# Patient Record
Sex: Female | Born: 1935 | Race: White | Hispanic: No | Marital: Married | Smoking: Never smoker
Health system: Southern US, Community
[De-identification: ages and names within clinical notes are randomized; demographics above are authoritative.]

## PROBLEM LIST (undated history)

## (undated) DIAGNOSIS — M1990 Unspecified osteoarthritis, unspecified site: Secondary | ICD-10-CM

## (undated) DIAGNOSIS — R51 Headache: Secondary | ICD-10-CM

## (undated) DIAGNOSIS — R112 Nausea with vomiting, unspecified: Secondary | ICD-10-CM

## (undated) DIAGNOSIS — I10 Essential (primary) hypertension: Secondary | ICD-10-CM

## (undated) DIAGNOSIS — R519 Headache, unspecified: Secondary | ICD-10-CM

## (undated) DIAGNOSIS — G629 Polyneuropathy, unspecified: Secondary | ICD-10-CM

## (undated) DIAGNOSIS — E039 Hypothyroidism, unspecified: Secondary | ICD-10-CM

## (undated) DIAGNOSIS — J189 Pneumonia, unspecified organism: Secondary | ICD-10-CM

## (undated) DIAGNOSIS — N189 Chronic kidney disease, unspecified: Secondary | ICD-10-CM

## (undated) DIAGNOSIS — C801 Malignant (primary) neoplasm, unspecified: Secondary | ICD-10-CM

## (undated) DIAGNOSIS — D649 Anemia, unspecified: Secondary | ICD-10-CM

## (undated) DIAGNOSIS — K219 Gastro-esophageal reflux disease without esophagitis: Secondary | ICD-10-CM

## (undated) DIAGNOSIS — Z9889 Other specified postprocedural states: Secondary | ICD-10-CM

## (undated) DIAGNOSIS — E119 Type 2 diabetes mellitus without complications: Secondary | ICD-10-CM

## (undated) HISTORY — PX: ABDOMINAL HYSTERECTOMY: SHX81

## (undated) HISTORY — PX: BILATERAL OOPHORECTOMY: SHX1221

## (undated) HISTORY — PX: TYMPANOSTOMY TUBE PLACEMENT: SHX32

## (undated) HISTORY — PX: CHOLECYSTECTOMY: SHX55

## (undated) HISTORY — PX: FOOT SURGERY: SHX648

## (undated) HISTORY — PX: EYE SURGERY: SHX253

---

## 1998-03-13 ENCOUNTER — Other Ambulatory Visit: Admission: RE | Admit: 1998-03-13 | Discharge: 1998-03-13 | Payer: Self-pay

## 1999-01-08 ENCOUNTER — Ambulatory Visit (HOSPITAL_COMMUNITY): Admission: RE | Admit: 1999-01-08 | Discharge: 1999-01-08 | Payer: Self-pay | Admitting: Neurosurgery

## 1999-01-13 ENCOUNTER — Encounter: Payer: Self-pay | Admitting: Neurosurgery

## 1999-01-13 ENCOUNTER — Ambulatory Visit (HOSPITAL_COMMUNITY): Admission: RE | Admit: 1999-01-13 | Discharge: 1999-01-13 | Payer: Self-pay | Admitting: Neurosurgery

## 1999-01-22 ENCOUNTER — Encounter: Payer: Self-pay | Admitting: Neurosurgery

## 1999-01-26 ENCOUNTER — Inpatient Hospital Stay (HOSPITAL_COMMUNITY): Admission: RE | Admit: 1999-01-26 | Discharge: 1999-01-27 | Payer: Self-pay | Admitting: Neurosurgery

## 1999-01-26 ENCOUNTER — Encounter: Payer: Self-pay | Admitting: Neurosurgery

## 1999-02-24 ENCOUNTER — Encounter: Payer: Self-pay | Admitting: Neurosurgery

## 1999-02-24 ENCOUNTER — Encounter: Admission: RE | Admit: 1999-02-24 | Discharge: 1999-02-24 | Payer: Self-pay | Admitting: Neurosurgery

## 1999-09-23 ENCOUNTER — Ambulatory Visit (HOSPITAL_COMMUNITY): Admission: RE | Admit: 1999-09-23 | Discharge: 1999-09-23 | Payer: Self-pay | Admitting: Gastroenterology

## 2001-11-22 ENCOUNTER — Other Ambulatory Visit: Admission: RE | Admit: 2001-11-22 | Discharge: 2001-11-22 | Payer: Self-pay | Admitting: Obstetrics and Gynecology

## 2002-12-23 ENCOUNTER — Ambulatory Visit (HOSPITAL_COMMUNITY): Admission: RE | Admit: 2002-12-23 | Discharge: 2002-12-23 | Payer: Self-pay | Admitting: Orthopedic Surgery

## 2003-03-04 ENCOUNTER — Ambulatory Visit (HOSPITAL_COMMUNITY): Admission: RE | Admit: 2003-03-04 | Discharge: 2003-03-04 | Payer: Self-pay | Admitting: Gastroenterology

## 2003-12-22 ENCOUNTER — Ambulatory Visit: Payer: Self-pay | Admitting: Oncology

## 2004-02-08 HISTORY — PX: JOINT REPLACEMENT: SHX530

## 2004-02-10 ENCOUNTER — Ambulatory Visit: Payer: Self-pay | Admitting: Oncology

## 2004-03-30 ENCOUNTER — Other Ambulatory Visit: Admission: RE | Admit: 2004-03-30 | Discharge: 2004-03-30 | Payer: Self-pay | Admitting: Obstetrics and Gynecology

## 2004-04-05 ENCOUNTER — Ambulatory Visit: Payer: Self-pay | Admitting: Oncology

## 2004-04-12 ENCOUNTER — Inpatient Hospital Stay (HOSPITAL_COMMUNITY): Admission: RE | Admit: 2004-04-12 | Discharge: 2004-04-16 | Payer: Self-pay | Admitting: Orthopedic Surgery

## 2004-05-24 ENCOUNTER — Ambulatory Visit: Payer: Self-pay | Admitting: Oncology

## 2004-08-02 ENCOUNTER — Ambulatory Visit: Payer: Self-pay | Admitting: Oncology

## 2004-10-12 ENCOUNTER — Ambulatory Visit: Payer: Self-pay | Admitting: Oncology

## 2004-12-06 ENCOUNTER — Ambulatory Visit: Payer: Self-pay | Admitting: Oncology

## 2005-02-02 ENCOUNTER — Ambulatory Visit: Payer: Self-pay | Admitting: Oncology

## 2005-03-21 ENCOUNTER — Ambulatory Visit: Payer: Self-pay | Admitting: Oncology

## 2005-06-13 ENCOUNTER — Ambulatory Visit: Payer: Self-pay | Admitting: Oncology

## 2005-07-05 ENCOUNTER — Ambulatory Visit: Payer: Self-pay | Admitting: Oncology

## 2005-08-16 ENCOUNTER — Ambulatory Visit: Payer: Self-pay | Admitting: Oncology

## 2005-10-06 ENCOUNTER — Ambulatory Visit: Payer: Self-pay | Admitting: Oncology

## 2005-10-24 ENCOUNTER — Ambulatory Visit: Payer: Self-pay | Admitting: Oncology

## 2005-11-07 ENCOUNTER — Ambulatory Visit: Payer: Self-pay | Admitting: Oncology

## 2005-11-21 ENCOUNTER — Ambulatory Visit: Payer: Self-pay | Admitting: Oncology

## 2005-12-05 ENCOUNTER — Ambulatory Visit: Payer: Self-pay | Admitting: Oncology

## 2005-12-19 ENCOUNTER — Ambulatory Visit: Payer: Self-pay | Admitting: Oncology

## 2006-01-02 ENCOUNTER — Ambulatory Visit: Payer: Self-pay | Admitting: Oncology

## 2006-01-16 ENCOUNTER — Ambulatory Visit: Payer: Self-pay | Admitting: Oncology

## 2006-01-27 ENCOUNTER — Ambulatory Visit: Payer: Self-pay | Admitting: Oncology

## 2006-02-13 ENCOUNTER — Ambulatory Visit: Payer: Self-pay | Admitting: Oncology

## 2006-11-03 ENCOUNTER — Ambulatory Visit: Payer: Self-pay | Admitting: Oncology

## 2009-02-07 HISTORY — PX: MASTOIDECTOMY: SHX711

## 2009-03-17 ENCOUNTER — Encounter: Admission: RE | Admit: 2009-03-17 | Discharge: 2009-03-17 | Payer: Self-pay | Admitting: Orthopedic Surgery

## 2010-04-06 ENCOUNTER — Other Ambulatory Visit: Payer: Self-pay | Admitting: Otolaryngology

## 2010-04-06 DIAGNOSIS — H6690 Otitis media, unspecified, unspecified ear: Secondary | ICD-10-CM

## 2010-04-06 DIAGNOSIS — H9201 Otalgia, right ear: Secondary | ICD-10-CM

## 2010-04-08 ENCOUNTER — Other Ambulatory Visit: Payer: Self-pay | Admitting: Internal Medicine

## 2010-04-08 ENCOUNTER — Ambulatory Visit
Admission: RE | Admit: 2010-04-08 | Discharge: 2010-04-08 | Disposition: A | Discharge status: Home / Self Care | Payer: Medicare Other | Source: Ambulatory Visit | Attending: Internal Medicine | Admitting: Internal Medicine

## 2010-04-08 ENCOUNTER — Ambulatory Visit
Admission: RE | Admit: 2010-04-08 | Discharge: 2010-04-08 | Disposition: A | Discharge status: Home / Self Care | Payer: Medicare Other | Source: Ambulatory Visit | Attending: Otolaryngology | Admitting: Otolaryngology

## 2010-04-08 DIAGNOSIS — R05 Cough: Secondary | ICD-10-CM

## 2010-04-08 DIAGNOSIS — H9201 Otalgia, right ear: Secondary | ICD-10-CM

## 2010-04-08 DIAGNOSIS — H6690 Otitis media, unspecified, unspecified ear: Secondary | ICD-10-CM

## 2010-06-07 ENCOUNTER — Encounter (HOSPITAL_COMMUNITY)
Admission: RE | Admit: 2010-06-07 | Discharge: 2010-06-07 | Disposition: A | Discharge status: Home / Self Care | Payer: Medicare Other | Source: Ambulatory Visit | Attending: Otolaryngology | Admitting: Otolaryngology

## 2010-06-07 LAB — DIFFERENTIAL
Basophils Relative: 0 % (ref 0–1)
Eosinophils Relative: 2 % (ref 0–5)
Lymphs Abs: 1.3 10*3/uL (ref 0.7–4.0)
Monocytes Absolute: 0.5 10*3/uL (ref 0.1–1.0)
Monocytes Relative: 9 % (ref 3–12)

## 2010-06-07 LAB — COMPREHENSIVE METABOLIC PANEL WITH GFR
ALT: 17 U/L (ref 0–35)
AST: 22 U/L (ref 0–37)
Albumin: 3.7 g/dL (ref 3.5–5.2)
Alkaline Phosphatase: 47 U/L (ref 39–117)
BUN: 20 mg/dL (ref 6–23)
CO2: 29 meq/L (ref 19–32)
Calcium: 9.9 mg/dL (ref 8.4–10.5)
Chloride: 99 meq/L (ref 96–112)
Creatinine, Ser: 1.16 mg/dL (ref 0.4–1.2)
GFR calc non Af Amer: 46 mL/min — ABNORMAL LOW
Glucose, Bld: 224 mg/dL — ABNORMAL HIGH (ref 70–99)
Potassium: 4.2 meq/L (ref 3.5–5.1)
Sodium: 135 meq/L (ref 135–145)
Total Bilirubin: 0.8 mg/dL (ref 0.3–1.2)
Total Protein: 6.5 g/dL (ref 6.0–8.3)

## 2010-06-07 LAB — CBC
HCT: 36 % (ref 36.0–46.0)
Hemoglobin: 11.8 g/dL — ABNORMAL LOW (ref 12.0–15.0)
MCH: 31.6 pg (ref 26.0–34.0)
MCHC: 32.8 g/dL (ref 30.0–36.0)
MCV: 96.3 fL (ref 78.0–100.0)
Platelets: 264 K/uL (ref 150–400)
RBC: 3.74 MIL/uL — ABNORMAL LOW (ref 3.87–5.11)
RDW: 14.6 % (ref 11.5–15.5)
WBC: 5.6 K/uL (ref 4.0–10.5)

## 2010-06-07 LAB — PROTIME-INR
INR: 0.97 (ref 0.00–1.49)
Prothrombin Time: 13.1 s (ref 11.6–15.2)

## 2010-06-07 LAB — APTT: aPTT: 29 s (ref 24–37)

## 2010-06-10 ENCOUNTER — Observation Stay (HOSPITAL_COMMUNITY)
Admission: RE | Admit: 2010-06-10 | Discharge: 2010-06-11 | Disposition: A | Discharge status: Home / Self Care | Payer: Medicare Other | Source: Ambulatory Visit | Attending: Otolaryngology | Admitting: Otolaryngology

## 2010-06-10 DIAGNOSIS — G609 Hereditary and idiopathic neuropathy, unspecified: Secondary | ICD-10-CM | POA: Insufficient documentation

## 2010-06-10 DIAGNOSIS — H903 Sensorineural hearing loss, bilateral: Secondary | ICD-10-CM | POA: Insufficient documentation

## 2010-06-10 DIAGNOSIS — E119 Type 2 diabetes mellitus without complications: Secondary | ICD-10-CM | POA: Insufficient documentation

## 2010-06-10 DIAGNOSIS — H6990 Unspecified Eustachian tube disorder, unspecified ear: Secondary | ICD-10-CM | POA: Insufficient documentation

## 2010-06-10 DIAGNOSIS — N289 Disorder of kidney and ureter, unspecified: Secondary | ICD-10-CM | POA: Insufficient documentation

## 2010-06-10 DIAGNOSIS — H6980 Other specified disorders of Eustachian tube, unspecified ear: Secondary | ICD-10-CM | POA: Insufficient documentation

## 2010-06-10 DIAGNOSIS — I10 Essential (primary) hypertension: Secondary | ICD-10-CM | POA: Insufficient documentation

## 2010-06-10 DIAGNOSIS — H7010 Chronic mastoiditis, unspecified ear: Principal | ICD-10-CM | POA: Insufficient documentation

## 2010-06-10 DIAGNOSIS — Z01812 Encounter for preprocedural laboratory examination: Secondary | ICD-10-CM | POA: Insufficient documentation

## 2010-06-10 LAB — GLUCOSE, CAPILLARY: Glucose-Capillary: 161 mg/dL — ABNORMAL HIGH (ref 70–99)

## 2010-06-11 LAB — GLUCOSE, CAPILLARY
Glucose-Capillary: 117 mg/dL — ABNORMAL HIGH (ref 70–99)
Glucose-Capillary: 71 mg/dL (ref 70–99)
Glucose-Capillary: 190 mg/dL — ABNORMAL HIGH (ref 70–99)

## 2010-06-22 NOTE — Discharge Summary (Signed)
NAMECHRISTIONA, Kimberly Baker NO.:  [id number]  MEDICAL RECORD NO.:  [id number]           PATIENT TYPE:  I  LOCATION:  3315                         FACILITY:  [facility]  PHYSICIAN:  Carolan Shiver, M.D.    DATE OF BIRTH:  11-17-1933  DATE OF ADMISSION:  06/10/2010 DATE OF DISCHARGE:  06/11/2010                              DISCHARGE SUMMARY   ADMISSION DIAGNOSES: 1. Chronic mastoiditis, left ear. 2. Type 1 diabetes mellitus. 3. History of cardiac disease. 4. History of cervical injury with a cervical plate. 5. History of right knee replacement. 6. Hypertension.  DISCHARGE DIAGNOSES: 1. Chronic mastoiditis, left ear. 2. Type 1 diabetes mellitus. 3. History of cardiac disease. 4. History of cervical injury with a cervical plate. 5. History of right knee replacement. 6. Hypertension.  OPERATION:  Left canal wall up tympanomastoidectomy with bilateral myringotomies and transtympanic modified Richards T-tubes, both ears. SURGEON:  Carolan Shiver, MD  ANESTHESIA:  General endotracheal, Dr. Arta Bruce.  COMPLICATIONS:  None.  DISCHARGE STATUS:  Stable.  SUMMARY OF HOSPITALIZATION:  Kimberly Baker is a 77 year old white female, type 1 diabetic, who developed chronic left mastoiditis documented on a CT scan.  The patient had had tubes placed in her ears in the 1970s.  She is going on to develop a chronic left mastoiditis with symptoms.  Because of this, she was recommended for a left canal wall up tympanomastoidectomy and BMTs with modified Richards T-tube.  In the morning of Jun 10, 2010, the patient was taken to [facility] operating room #3 and underwent an uncomplicated left canal wall up tympanomastoidectomy.  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  The disease was removed, her attic was opened, ossicles were found to be intact and mobile and the modified Richards T-tube was inserted in both tympanic membranes.  The  patient was transferred to the PACU and had an uncomplicated recovery.  She was then transferred to 3300 room 3315 ICU step-down unit for overnight observation and cardiac monitoring.  She had an uncomplicated afebrile postoperative course.  She developed some apnea during the evening and required some O2 supplementation.  Glucose fluctuated widely, her normal range is  70-400.  Her glucose in the evening of Jun 10, 2010, was 160 and she received Humalog coverage.  She had a hypoglycemic event at 2100 hours with a glucose of 47.  She was given 15 g of carbohydrates snack and a followup CBG was 117.  By the morning of Jun 11, 2010, at 6 a.m., her glucose is 195.  On the morning of Jun 11, 2010, the patient's left postauricular drain was removed.  Her incision was stable and without hematoma.  Packing was in place.  She had intact facial function.  No nystagmus.  No nausea or vomiting.  There was no vertigo.  She was able to urinate and was eating.  The patient was discharged on the morning of Jun 11, 2010, with her daughter, Olegario Messier, a Engineer, civil (consulting).  She was then instructed to return to my office on Jun 17, 2010, at 1:50  p.m.  She is to follow a regular ADA diet and her insulin regimen which included Lantus insulin 12 units subcu nightly; Humalog sliding scale 5 units at breakfast, 4 units at lunch, 8 units at dinner, 12 units at bedtime subcutaneous again after a meal.  Her new discharge medications include the following: 1. Cipro 500 mg p.o. b.i.d. x10 days, 20 tablets. 2. Percocet 5/325 one to two p.o. q.4 h-6 h. p.r.n. pain, #30 tabs. 3. Cipro HC 3 drops, both ears t.i.d. x7 days. 4. Valium 5 mg p.o. t.i.d. p.r.n. vertigo, #30.  Her other home medications other than the insulin noted above include the following: 1. Fenofibrate 160 mg p.o. daily. 2. Pravastatin 40 mg p.o. q.evening. 3. Carvedilol 12.5 mg 1 tablet b.i.d. 4. Amlodipine/benazepril 10/20 mg 1 tablet p.o. daily. 5. Levothyroxine  50 mcg p.o. daily. 6. Estradiol 2 mg 1 p.o. daily. 7. Zolpidem 10 mg 0.5 tablet p.o. nightly. 8. Cymbalta 30 mg 1 capsule p.o. nightly. 9. Aspirin 81 mg p.o. daily. 10.Calcium carbonate with vitamin D 1 tablet p.o. b.i.d. 11.Fish oil 1000 mg 1 capsule p.o. b.i.d. 12.Allegra 1 tablet p.o. daily.  The patient was instructed to keep her head elevated on 2 pillows for the next 3 evenings, avoid water exposure left ear until seeing me and returned, and avoid heavy lifting or straining.  Her daughter was instructed to call to [phone] for any postoperative problems directly related to the procedure.  She was given both verbal and written instructions and informed that the instructions are available on our website at [email or web address]  ADMISSION LABORATORY DATA:  White blood cell count 5600, hemoglobin 11.8, hematocrit 36.0, platelet count 264,000.  PT 13.1, PTT 29, INR 0.97.  Electrolytes were within normal limits with potassium of 4.2, glucose was 224.  Chest x-ray showed no active disease.  EKG showed a regular rhythm.  At the time of hospitalization, the patient was in short stay, the operating room #3 in the main OR, the PACU again and then room 3315.  DISCHARGE CONDITION AND STATUS:  Equal, stable.     Carolan Shiver, M.D.     EMK/MEDQ  D:  06/11/2010  T:  06/11/2010  Job:  [id number]  Electronically Signed by Ermalinda Barrios M.D. on 06/22/2010 10:50:02 AM

## 2010-06-22 NOTE — H&P (Signed)
[name], Baker NO.:  [id number]  MEDICAL RECORD NO.:  [id number]           PATIENT TYPE:  I  LOCATION:  3315                         FACILITY:  [facility]  PHYSICIAN:  Kimberly Baker, M.D.    DATE OF BIRTH:  07-12-1933  DATE OF ADMISSION:  06/10/2010 DATE OF DISCHARGE:  06/11/2010                             HISTORY & PHYSICAL   CHIEF COMPLAINT:  Left ear pain and hearing loss.  HISTORY OF PRESENT ILLNESS:  Kimberly Baker is a 77 year old white female who is being admitted to the hospital today for a left canal tympanomastoidectomy to treat chronic left mastoiditis and chronic eustachian tube dysfunction in both ears.  The patient has had tubes placed in both ears in the 1970s on 2 occasions.  This winter in January and February 2012, she developed chronic left otalgia and a CT scan of her temporal bones documented chronic masked mastoiditis.  The patient is known to be a type 1 diabetic and because of the appearance of her mastoid on CT scan she was recommended for left canal tympanomastoidectomy and BMTs with modified Richards T-tubes.  Risks, complications, and alternatives of the procedure were explained to her and to her daughter.  Questions were invited and answered and informed consent was signed and witnessed.  She underwent preop cardiac clearance to prepare for the general anesthetic.  Preop audiometric testing on June 07, 2010, documented SRTs of 45 dB in both ears with 96% discrimination in both ears.  Her left canal tympanomastoidectomy and BMTs with T-tubes were scheduled for the morning of Jun 10, 2010, 7:30 a.m., room #3, [facility], Dr. Arta Baker, anesthesiologist.  PAST MEDICAL HISTORY:  Previous illnesses include type 1 diabetes mellitus and cardiac disease.  Prior operations include cervical procedure and a right knee replacement.  Medications include the following: 1. Lantus insulin 12 units every night. 2.  Humalog insulin 5 units at breakfast, 4 units at lunch, 8 units at     dinner, 12 units at bedtime subcu during meals. 3. Fenofibrate 160 mg daily. 4. Pravastatin 40 mg daily. 5. Carvedilol 12 mg p.o. daily. 6. Amlodipine and benazepril 10/20 mg 1 tablet p.o. daily. 7. Levothyroxine 15 mcg p.o. daily. 8. Estradiol 2 mg p.o. daily. 9. Zolpidem 10 mg 1/2 tablet p.o. every night. 10.Cymbalta 30 mg p.o. every night. 11.Aspirin 81 mg p.o. daily. 12.Calcium carbonate 1 tablet b.i.d. 13.Fish oil 1000 mg b.i.d. 14.Allegra 1 tablet p.o. daily.  Allergies to medications include COMPAZINE.  FAMILY HISTORY:  Positive for diabetes and heart disease in her immediate family.  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  REVIEW OF SYSTEMS:  Positive for type 1 diabetes mellitus, cardiac disease, wearing corrective lenses.  Moderate to moderately severe bilateral sensorineural hearing losses with good discrimination, history of eustachian tube dysfunction, history of hypertension.  She has generalized arthritis.  She previously had a basal cell carcinoma removed.  She has had chronic anemia, hayfever, and seasonal allergies.  PHYSICAL EXAMINATION:  VITAL SIGNS:  Stable. GENERAL:  She  was awake, alert, spontaneous, coherent, and logical. Facial function was intact and symmetric. HEENT:  External ears and canals were normal.  TMs were both retracted, but otherwise clear and mobile.  External and internal nasal exams normal.  Oral cavity, lips, and palate normal. NECK:  Negative, supple.  No cervical lymphadenopathy or thyromegaly. CHEST:  Clear. HEART:  Normal sinus rhythm. ABDOMEN:  Benign. GENITALIA AND RECTAL:  Not done. NEUROLOGIC:  Physiologic.  The patient does have some generalized arthritis. EXTREMITIES:  Normal.  Preop audiometric testing:  SRTs of 45 dB in both ears with 96% discrimination in both ears and  moderate to moderately severe sloping sensorineural hearing losses in both ears.  CT scan of her temporal bone showed chronic left mastoiditis.  This was a masked mastoiditis. Hemoglobin was 11.8, hematocrit 36, white blood cell count 5600, and platelet count 264,000.  PT 13.1, PTT 29, and INR 0.97.  Electrolytes within normal limits with sodium 135, potassium 4.2, chloride 99, CO2 of 29, and glucose was elevated at 224.  Chest x-ray showed stable cardiopulmonary appearance with no focal or acute abnormalities seen. There was screw plate fixation of the lower cervical spine, some midthoracic degenerative changes, and degenerative changes of the left glenohumeral joint which were unchanged.  Heart and mediastinal contours were stable.  Lung fields were clear without signs of infiltrates or congestive heart failure.  There was no pleural fluid or significant peribronchial cuffing.  A nuclear medicine ventilation-perfusion lung scan performed on April 14, 2010, showed normal lung scan with no evidence of pulmonary emboli.  Chest x-ray again showed stable chest with no active lung disease.  EKG showed normal sinus rhythm with abnormal QRS-T angles.  IMPRESSION: 1. Chronic masked mastoiditis, left ear. 2. Eustachian tube dysfunction, both ears. 3. Type 1 diabetes mellitus. 4. History of hypertension. 5. History of cardiac disease. 6. History of anemia. 7. Moderate to moderately severe bilateral sensorineural hearing     losses. 8. History of cervical disease, status post cervical procedure. 9. Status post right knee replacement.  PLAN:  The patient is scheduled for a left canal tympanomastoidectomy and bilateral myringotomies and transtympanic modified Richards T-tubes to treat her chronic left mastoiditis and chronic eustachian tube dysfunction.  The procedure is scheduled for Jun 10, 2010, 7:30 a.m., [facility], room #3, Dr. Arta Baker, anesthesiologist.  Risks,  complications, and alternatives of the procedures were explained to the patient and to her daughter, Kimberly Baker, nurse.  Questions were invited and answered and informed consent was signed and witnessed.     Kimberly Baker, M.D.     EMK/MEDQ  D:  06/10/2010  T:  06/10/2010  Job:  [id number]  Electronically Signed by Ermalinda Barrios M.D. on 06/22/2010 10:50:36 AM

## 2010-06-22 NOTE — Op Note (Signed)
[name], [name] NO.:  [id number]  MEDICAL RECORD NO.:  [id number]           PATIENT TYPE:  I  LOCATION:  3315                         FACILITY:  [facility]  PHYSICIAN:  Carolan Shiver, M.D.    DATE OF BIRTH:  04/03/1933  DATE OF PROCEDURE:  06/10/2010 DATE OF DISCHARGE:  06/11/2010                              OPERATIVE REPORT   JUSTIFICATION FOR PROCEDURE:  Kimberly Baker is a 77 year old white female who is here today for a left canal-up tympanomastoidectomy and bilateral myringotomies and transtympanic T-tubes to treat chronic mastoiditis left ear and chronic eustachian tube dysfunction both ears.  Kimberly Baker presented to me on September 10, 2009, with a history of progressive hearing loss.  She had been losing hearing during the last 6 years.  She was asymptomatic for otorrhea, otalgia, tinnitus, or vertigo.  She had tubes placed in both ears x2 in the 1970s.  She had tried hearing aids in the past but was not pleased with their fit or the sound quality.  At that time, she was found to have moderate to moderately severe bilateral sensorineural hearing losses, type 2 diabetes mellitus with a history of heart disease, and failed a hearing aid trial.  She was recommended for binaural digital BTE hearing aids. On March 09, 2010, she contacted the office.  She had had several ear infections treated by her PCP and wanted a followup.  The patient was seen on April 01, 2010.  At that time, she complained of left ear pain and hearing loss.  She was found to have significant eustachian tube dysfunction with negative middle ear pressures.  She had a type C tympanogram right ear with a -3.45 curve and a -C 0.19 curve left ear. She was placed on Ciprodex drops and scheduled for CT scan of her temporal bones to rule out mastoiditis.  Indeed, she was found to have a chronic mastoiditis in the left mastoid and was seen again in follow up on April 26, 2010, to discuss the  tympanomastoidectomy.  The patient also complained of cervical pain at that time and now had become a type 1 diabetic.  She was recommended for left canal-up tympanomastoidectomy with insertion of T-tubes bilaterally.  A long discussion with the patient and her daughter Kimberly Baker, a Engineer, civil (consulting).  Risks, complications, and alternatives of tympanomastoidectomy were explained to the patient and to her daughter.  Questions were invited and answered, and informed consent was signed and witnessed.  The patient was scheduled for 4 hours [facility] main operating room, general endotracheal anesthesia with 23 hours of observation overnight in a ICU step-down unit.  Preop audiometric testing on June 07, 2010, documented SRTs of 45 dB both ears with 96% discrimination both ears.  JUSTIFICATION FOR INPATIENT SETTING:  The patient's age and need for general endotracheal anesthesia.  JUSTIFICATION FOR OVERNIGHT STAY:  23 hours of observation in the ICU step-down unit given the patient's cardiac and anemia history.  The patient is also a type 1 diabetic.  PREOPERATIVE DIAGNOSES: 1. Chronic mastoiditis, left ear. 2. Chronic eustachian tube dysfunction, both ears. 3. Type 1 diabetes mellitus.  4. History of cardiac disease and anemia.  POSTOPERATIVE DIAGNOSES: 1. Chronic mastoiditis, left ear. 2. Chronic eustachian tube dysfunction, both ears. 3. Type 1 diabetes mellitus. 4. History of cardiac disease and anemia.  OPERATION: 1. Left canal-up tympanomastoidectomy. 2. Bilateral myringotomies and transmitting modified Richards T-tube.  SURGEON:  Carolan Shiver, MD  ANESTHESIA:  General endotracheal, Dr. Michelle Piper, CRNA, Waynetta Sandy.  COMPLICATIONS:  None.  SUMMARY OF REPORT:  After the patient was taken to the operating room, she was placed in the supine position.  An IV had been begun in the holding area.  General IV induction was performed under the guidance of Dr. Arta Bruce, and the patient was orally intubated  without difficulty.  Eyelids were taped shut.  She was properly positioned and monitored.  Elbows and ankles were padded with firm rubber and a Foley catheter was inserted.  Preoperative glucose was 117.  After being properly positioned and monitored, I initiated a time-out.  A small amount of hair was clipped in the left postauricular area.  Hair was taped, a stocking cap was applied.  However, left external ear and hemiface as well as postauricular area were cleansed with chlorhexidine. A left postauricular incision was marked and infiltrated with 5 mL of 1% Xylocaine with 1:100,000 epinephrine.  Silver wire needle electrodes were then inserted into the left orbicularis oris and oculi muscles and suprasternal notch and connected to a facial nerve monitor preamplifier.  Using the operating room microscope, the left ear canal was then examined.  There was a thick crust covering the inferior one-half of her tympanic membrane.  This was removed.  The ear canal was lavaged with saline.  Four quadrant ear canal blocks were then performed with a 0.5 mL of 2% Xylocaine with 1:50,000 epinephrine.  Examination of the left tympanic membrane revealed a mildly retracted tympanic membrane with no fluid in the left middle ear space.  The left postauricular incision was then made and carried down through skin and subcutaneous tissue.  A T-shaped incision was made in the muscular periosteal layer with electrocautery unit.  Anterior and posterior subperiosteal flaps were then elevated.  The posterior aspect of the incision was undermined.  Self-retaining Schuknecht retractors were then inserted.  Using a Science writer, the mastoid cortex was then removed.  The mastoid was found to be very osteitic with very soft bone.  There were some scattered fibrotic tissue, but no evidence of any cholesteatoma or frank purulence.  A complete mastoidectomy was then performed.   The sigmoid sinus was skeletonized and followed to the digastric ridge.  The patient had a deep mastoid tip.  These cells along the posterior canal wall were removed. Trautmann triangle was opened and cleaned and the tegmen mastoideum was also sculpted.  She had a very low hanging dura in the sinodural angle. The dura was exposed in this area but not disrupted.  The dissection was then carried down through the antrum and through the aditus ad antrum. The patient was found to have an attic block of thickened fibrotic tissue and mucosa.  The tissue was removed.  The attic was evaluated. The body of the incus was identified and was found to be normal location and the incus was mobile.  There was no disease found in the attic proper.  Cavity was then polished with diamond burs using continuous suction irrigation.  Some bone wax was used to control bleeding in the posterior edge of the mastoid rim.  The posterior  canal wall skin was then dissected medially towards the annulus with a Therapist, nutritional, and an incision was then made from 6-12 o'clock just medial to the [facility] junction with a 59-10 beaver blade.  A self-retaining Bellucci retractor was inserted.  An anterior radial myringotomy incision was made, and a modified Richards T-tube wasinserted.  Vertical incision was then made at 5 o'clock, and an atticotomy flap was elevated.  The middle ear was entered posterior superiorly with curved Turner needle.  The chorda tympani nerve was identified, but not disturbed.  Middle ear was examined and found to have normal middle ear mucosa.  The ossicles were intact and mobile.  Bacitracin containing saline irrigation fluid was then irrigated from the antrum through the attic into the middle ear and passed freely.  The atticotomy flap was then returned to anatomic position.  The medial canal was then packed with Gelfoam disks soaked in Cipro HC, and the lateral canal was packed with a quarter-inch  iodoform Nu Gauze wick impregnated with bacitracin ointment.  A sliver Penrose drain was placed in the mastoid cavity.  The postauricular incision was then closed in three layers using interrupted inverted 3-0 Vicryls for the muscular periosteal layer, and the same suture for the subcutaneous layer prior to closing the subcutaneous layer.  Hemostasis was obtained, and the site was copiously irrigated with bacitracin containing saline.  Skin was closed with running locking 5-0 Novafil and bacitracin ointment was applied.  The ear was padded with Telfa and cotton, and a standard adult Glasscock mastoid dressing was applied loosely in the standard fashion.  Should mention that prior to beginning the left ear, the right ear canal was cleaned of cerumen and debris, and an anterior radial myringotomy incision was made in the retracted tympanic membrane.  A modified Richards T-tube was inserted.  Cipro HC drops were placed.  Total fluids 1800 amounts.  Total urine output 100 amounts.  Estimated blood loss less than 20 mL.  Sponge, needle, and cotton ball counts were correct at the termination of the procedure.  The patient received Ancef 1 g IV, Zofran 4 mg IV at the beginning of the procedure.  Decadron was not used.  Also, a Transderm scopolamine patch was not used because of the patient's previous nausea with scopolamine.  Microdissection using the operating room microscope was utilized during this procedure.  Kimberly Baker will be admitted to the PACU then to the 3300 ICU step-down unit for 23 hours of observation and cardiac monitoring.  If stable overnight, she will be discharged on Jun 11, 2010, with her family to clear daughter who will be instructed to return her to my office on Jun 17, 2010, at 1:50 p.m.  DISCHARGE MEDICATIONS: 1. Cipro 500 mg p.o. b.i.d. x10 days with food. 2. Cipro HC 3 drops both ears t.i.d. x1 week. 3. Percocet 5/325, #30 one to two p.o. q.4-6 h. p.r.n.  pain. 4. Valium 5 mg #30 1 p.o. t.i.d. p.r.n. vertigo. She is to follow hair regular diabetic diet and all of her home medications.  She is to call [phone] for any postoperative problems directly related to the procedure.  She will be given both verbal and written instructions.  SUMMARY:  The patient was found to have chronic osteitis of her left mastoid.  A complete canal-up tympanomastoidectomy was performed without difficulty or complications.  The patient was found to have an attic block.  Ossicles were intact and mobile.  Her chorda tympani nerve was dissected free and preserved.  The facial nerve was not disturbed and was covered by bone in the horizontal portion.  Middle ear mucosa was normal.  No prostheses or cement were used.  There were no complications.  Modified Richards T-tubes were inserted into both tympanic membranes.  Microdissection using the operating room microscope was utilized during the procedure.     Carolan Shiver, M.D.     EMK/MEDQ  D:  06/10/2010  T:  06/10/2010  Job:  [id number]  cc:   [facility]  Electronically Signed by Ermalinda Barrios M.D. on 06/22/2010 10:50:20 AM

## 2010-06-25 NOTE — H&P (Signed)
NAMEANTONIETTA, Kimberly Baker NO.:  [id number]   MEDICAL RECORD NO.:  [id number]          PATIENT TYPE:  INP   LOCATION:  NA                           FACILITY:  [facility]   PHYSICIAN:  Ollen Gross, M.D.    DATE OF BIRTH:  1933-12-27   DATE OF ADMISSION:  04/12/2004  DATE OF DISCHARGE:                                HISTORY & PHYSICAL   CHIEF COMPLAINT:  Right knee pain.   HISTORY OF PRESENT ILLNESS:  Kimberly Baker is a 77 year old female with a  several year history of progressively worsening right knee pain. She has had  two arthroscopic procedures on the right knee which have revealed  significant degenerative change in the lateral compartment. Her treatment  has also included to have multiple cortisone injections and a series of  Synvisc injections. The pain had gotten progressively worse over the past  several months. At this time, she is having pain at all times and recurrent  effusions. It is definitely limiting what she can and cannot do and limiting  her ability to do activities of daily living. She is at a stage now that she  would like for definitive treatment of this. We had discussed various  options and decided to proceed with total knee arthroplasty.   PAST MEDICAL HISTORY:  1.  Significant for hypothyroidism.  2.  Hyperlipidemia.  3.  Noninsulin-dependent diabetes.  4.  Hypertension.   PAST SURGICAL HISTORY:  Knee arthroscopy.   CURRENT MEDICATIONS:  1.  Glyburide/metformin 5/500 mg 2 p.o. b.i.d.  2.  Avandia 4 mg 1/2 tablet p.o. q.d.  3.  Coreg 12.5 mg 1 tablet p.o. b.i.d.  4.  Lotrel 5/20 mg 1 p.o. q.d.  5.  Tri-Chlor 145 mg 1 p.o. q.d.  6.  Lipitor 10 mg 1/2 tablet p.o. q.d.  7.  Levothroid 50 mcg 1 p.o. q.d.  8.  Estradiol 2 mg p.o. q.d.  9.  Mobic 15 mg p.o. q.d.  10. Ambien 10 mg p.o. q.h.s.   ALLERGIES:  She has no known drug allergies.   REVIEW OF SYMPTOMS:  Negative with the exception of the musculoskeletal  symptoms that she is  having.   SOCIAL HISTORY:  She is married with three children. Lives at home with her  husband in a three story house with 16 stairs and plans on going home under  the care of her family upon discharge.   PHYSICAL EXAMINATION:  GENERAL:  A very pleasant, well-developed, well-  nourished female. She is alert and oriented and in no apparent distress.  VITAL SIGNS:  Temperature is 98.4, pulse 66, respirations 16, blood pressure  150/70.  HEENT:  Within normal limits.  NECK:  Supple without adenopathy or thyromegaly. No carotid bruits.  LUNGS:  Clear to auscultation bilaterally.  CARDIOVASCULAR:  Regular rate and rhythm. No murmurs, gallops, or rubs.  ABDOMEN:  Nontender, nondistended with positive bowel sounds and no  organomegaly  MUSCULOSKELETAL:  Specifically right lower extremity, demonstrates no pain  on range of motion of the right hip. Right knee shows moderate effusion.  Range of motion is  about 10 to 115. There is marked crepitus on range of  motion. There is no ligamentous instability. There is a slight valgus  deformity.   LABORATORY DATA:  Radiographs have revealed significant arthritic change  right knee, bone-on-bone in the lateral patellar femoral compartment.   ASSESSMENT:  Right knee pain. She has end-stage arthritic changes in the  right knee. She has failed nonoperative management including arthroscopic  treatment and various injections. At this point, the most predictable means  of improving pain and function would be a total knee arthroplasty. We had  discussed that procedure, risks, potential complications, rehabilitation  course in detail and she elects to proceed. Date of surgery is April 12, 2004. She has obtained cardiac clearance. She will be admitted on the 6th  for knee replacement surgery.      FA/MEDQ  D:  04/08/2004  T:  04/08/2004  Job:  [id number]

## 2010-06-25 NOTE — Op Note (Signed)
NAME:  Kimberly Baker, Kimberly Baker                          ACCOUNT NO.:  [id number]   MEDICAL RECORD NO.:  [id number]                   PATIENT TYPE:  AMB   LOCATION:  ENDO                                 FACILITY:  [facility]   PHYSICIAN:  James L. Malon Kindle., M.D.          DATE OF BIRTH:  Nov 23, 1933   DATE OF PROCEDURE:  03/04/2003  DATE OF DISCHARGE:                                 OPERATIVE REPORT   PROCEDURE:  Colonoscopy.   MEDICATIONS:  Fentanyl 100 mcg, Versed 8 mg IV.   SCOPE:  Olympus pediatric colonoscope.   INDICATIONS FOR PROCEDURE:  Previous history of adenomatous polyps.   DESCRIPTION OF PROCEDURE:  The procedure had been explained to the patient  and consent obtained. With the patient in the left lateral decubitus  position, the Olympus scope was inserted and advanced. The prep was  excellent. We were able to advance easily to the cecum with abdominal  pressure and position changes.  The cecum was identified by identification  of the ileocecal valve and appendiceal orifice. The scope was withdrawn and  the cecum, ascending colon, transverse colon, descending and sigmoid colon  were seen well.  No polyps were seen. There was mild diverticulosis in the  sigmoid colon.  The scope was withdrawn. The patient tolerated the procedure  well and was maintained on low flow oxygen and pulse oximeter throughout the  procedure.   ASSESSMENT:  1. No evidence of polyp in a woman with previous history of adenomatous     polyps.  V12.72  2. Diverticulosis. 562.0   PLAN:  Will recommend repeating procedure in 5 years __________                                               Llana Aliment. Malon Kindle., M.D.    Waldron Session  D:  03/04/2003  T:  03/04/2003  Job:  [id number]   cc:   Harl Bowie, M.D.  [address]  [place]  [place] [address]  Fax: [phone]

## 2010-06-25 NOTE — Op Note (Signed)
NAME:  Kimberly Baker, Kimberly Baker                          ACCOUNT NO.:  [id number]   MEDICAL RECORD NO.:  [id number]                   PATIENT TYPE:  AMB   LOCATION:  DAY                                  FACILITY:  [facility]   PHYSICIAN:  Ollen Gross, M.D.                 DATE OF BIRTH:  1933-07-13   DATE OF PROCEDURE:  12/23/2002  DATE OF DISCHARGE:                                 OPERATIVE REPORT   PREOPERATIVE DIAGNOSES:  Right knee medial and lateral meniscal tears.   POSTOPERATIVE DIAGNOSES:  Right knee medial and lateral meniscal tears.   PROCEDURE:  Right knee arthroscopy with medial and lateral meniscal  debridement.   SURGEON:  Gus Rankin. Aluisio, M.D.   ASSISTANT:  None.   ANESTHESIA:  General.   ESTIMATED BLOOD LOSS:  Minimal.   DRAIN:  Hemovac x 1.   COMPLICATIONS:  None.   CONDITION:  Stable to recovery.   BRIEF CLINICAL NOTE:  [name] is a 77 year old female with severe pain  and mechanical symptoms and recurrent swelling of the right knee.  She had a  right knee arthroscopy done approximately five months ago and never had any  postop pain relief.  She has had the above-mentioned symptoms since.  She  has had injections and aspirations, but they have not helped.  She has even  had a series of Synvisc.  She was told that she needed the knee replaced and  then subsequently came to me for a second opinion.  Her work-up included an  MRI which showed meniscal tears.  She presents now for repeat arthroscopy  and debridement.   PROCEDURE IN DETAIL:  After the successful administration of general  anesthetic, a tourniquet is placed high on the right thigh and right lower  extremity prepped and draped in the usual sterile fashion.  Standard  superomedial and inferolateral incisions are made, inflow cannula passed  superomedial and camera passed in inferolateral.  Arthroscopic visualization  proceeds.  The suprapatellar region shows a fair amount of synovitis but no loose  bodies.  The undersurface of the patella shows some grade 2 change but no  full-thickness chondral defect.  Trochlea shows some grade 1 change,  otherwise normal.  Medial and lateral gutters are visualized, and there is  synovitis but no loose body.  Flexion in valgus force is supplied to the  knee, medial compartment is entered.  There is some grade 3 change on the  medial femoral condyle.  There is one area of significant size of some  unstable cartilage, and I localized the inferomedial portal with a spinal  needle and made a small incision, passed a probe, and there was some  unstable cartilage at the edge.  I debrided that back to a stable  cartilaginous base with the 4.2 shaver.  There was no area of exposed bone.  Medial meniscus does have a degenerative tear of  the posterior aspect, and I  debrided that back to a stable base with the shaver and basket.  The  intercondylar notch is then visualized.  The ACL appears and probes  normally.  The lateral compartment is entered, and she has bone-on-bone  change in the lateral compartment with exposed bone both on the tibial  plateau as well as the lateral femoral condyle.  She also has a degenerative  tear in the meniscal remnant.  The majority of the meniscus was gone but  what was remaining was torn.  I debrided that back to a stable base with the  shaver.  After probing it, found it to be stable.  The arthroscopic  equipment is then removed from the inferior portals which are closed with  interrupted 4-0 nylon.  Marcaine 0.25% 20 mL with epinephrine injected  through the inflow cannula, then a drain is threaded through the cannula,  the cannula removed, and the incision closed.  We did not sew the drain in.  The drain is hooked to the cannula, but suction not yet applied.  A bulky  sterile dressing is applied.  She is placed into the ice pack, awakened, and  transported to recovery in stable condition.                                                Ollen Gross, M.D.    FA/MEDQ  D:  12/23/2002  T:  12/23/2002  Job:  [id number]

## 2010-06-25 NOTE — Op Note (Signed)
Kimberly Baker, Kimberly Baker NO.:  [id number]   MEDICAL RECORD NO.:  [id number]          PATIENT TYPE:  INP   LOCATION:  0007                         FACILITY:  [facility]   PHYSICIAN:  Ollen Gross, M.D.    DATE OF BIRTH:  [birth year]   DATE OF PROCEDURE:  04/12/2004  DATE OF DISCHARGE:                                 OPERATIVE REPORT   PREOPERATIVE DIAGNOSIS:  Osteoarthritis, right knee.   POSTOPERATIVE DIAGNOSIS:  Osteoarthritis, right knee.   PROCEDURE:  Right total knee arthroplasty.   SURGEON:  Dr. Lequita Halt   ASSISTANT:  Avel Peace, PA-C   ANESTHESIA:  General with postop Marcaine pain pump.   ESTIMATED BLOOD LOSS:  Minimal.   DRAIN:  Hemovac x 1.   TOURNIQUET TIME:  39 minutes at 300 mmHg.   COMPLICATIONS:  None.   CONDITION:  Stable to recovery.   BRIEF CLINICAL NOTE:  Kimberly Baker is a 77 year old female with end-stage  arthritis of the right knee with intractable pain.  She has had two  arthroscopic procedures which provided temporary relief.  She has had  multiple injections.  The pain is now interfering with what she can and  cannot do.  She elects now for total knee arthroplasty.   PROCEDURE IN DETAIL:  After the successful administration of general  anesthetic, a tourniquet is placed high on the right thigh and right lower  extremity prepped and draped in the usual sterile fashion.  Extremity is  wrapped in Esmarch, knee flexed, and tourniquet inflated to 300 mmHg.  A  standard midline incision is made with a 10 blade through the subcutaneous  tissue to the level of the extensor mechanism.  Given that she had a valgus  deformity, we did a lateral parapatellar arthrotomy. Then the soft tissue  over the proximal and lateral tibia is subperiosteally elevated to the joint  line with a knife and into the edge of the joint with a Cobb elevator.  The  patella is then everted medially, knee flexed 90 degrees, and ACL and PCL  are removed.  Drill is used  to create a starting hole in the distal femur,  and the canal is irrigated.  A 5-degree right valgus alignment guide is  placed and referencing off the posterior condyles, rotation is marked and a  block pinned to remove 10 mm off the distal femur.  Distal femoral resection  is made with an oscillating saw.  A sizing block is placed, and a size 2.5  is most appropriate.  The cutting block is placed for a size 2.5, and then  the anterior, posterior, and chamfer cuts are made.   The tibia is then subluxed forward, and the menisci are removed.  Extramedullary tibial alignment guide is placed, referencing proximally at  the medial aspect of the tibial tubercle and distally along the second  metatarsal axis and tibial crest.  The block is pinned to remove about 4 mm  off the deficient lateral side, corresponding to about 8 mm off the less  deficient medial side.  Tibial resection is made with an oscillating  saw.  Size 2 is the most appropriate tibial component.  Then the proximal tibia is  prepared with the modular drill and keel punch for a size 2.  Femoral  preparation is completed now with the intercondylar cut.   Size 2 mobile bearing tibial tray trial with a size 2.5 posterior stabilized  femoral trial and a 10 mm posterior stabilized rotating platform insert  trial are placed.  Full extension is achieved with excellent varus and  valgus balance throughout full range of motion.  Patella is again everted  and thickness measured to be 19 mm.  Free-hand resection is taken down to 11  mm, 32 template is placed; lug holes are drilled; trial patellar is placed,  and it tracks normally.  The osteophytes are then removed off the posterior  femur with the trial in place.  All trials are removed and the cut bone  surfaces prepared with pulsatile lavage.  Cement is mixed and once ready for  implantation, the size 2 mobile bearing tibial tray, size 2.5 posterior  stabilized femur with 32 patella are  cemented into place, and the patella is  held with a clamp.  Trial 10 mm insert is placed and knee held in full  extension and all extruded cement removed.  Once the cement is fully  hardened, then the permanent 10 mm posterior stabilized rotating platform  insert is placed into the tibial tray.  The wound is copiously irrigated  with saline solution and then the tourniquet released for a total time of 39  minutes.  Minor bleeding stopped with electrocautery.  Fat pad and then  arthrotomy are closed with interrupted #1 PDS.  I left open the lateral  release from the superior to inferior pole of patella.  Flexion against  gravity is 140 degrees, and the patella tracks normally.  Subcu is closed  with interrupted 2-0 Vicryl, subcuticular with running 4-0 Monocryl.  The  catheter for the Marcaine pain pump is placed and the pump initiated.  The  incision is cleaned and dried and Steri-Strips and a bulky sterile dressing  applied.  Hemovac is hooked to suction, and then she is placed into a knee  immobilizer, awakened, and transported to recovery in stable condition.      FA/MEDQ  D:  04/12/2004  T:  04/12/2004  Job:  [id number]

## 2010-06-25 NOTE — Discharge Summary (Signed)
Kimberly Baker, Kimberly Baker NO.:  [id number]   MEDICAL RECORD NO.:  [id number]          PATIENT TYPE:  INP   LOCATION:  0464                         FACILITY:  [facility]   PHYSICIAN:  Ollen Gross, M.D.    DATE OF BIRTH:  01-28-1934   DATE OF ADMISSION:  04/12/2004  DATE OF DISCHARGE:  04/16/2004                                 DISCHARGE SUMMARY   ADMITTING DIAGNOSES:  1.  Osteoarthritis, right knee.  2.  Hypothyroidism.  3.  Hyperlipidemia.  4.  Non-insulin dependent diabetes mellitus.  5.  Hypertension.   DISCHARGE DIAGNOSES:  1.  Osteoarthritis, right, status post right total knee arthroplasty.  2.  Postoperative blood loss anemia.  3.  Status post transfusion without sequelae.  4.  Postoperative hyponatremia, improved.   PROCEDURE:  On April 12, 2004, right total knee arthroplasty.   SURGEON:  Ollen Gross, M.D.   ASSISTANT:  Alexzandrew L. Perkins, P.A.-C.   ANESTHESIA:  General.   BLOOD LOSS:  Minima.   DRAIN:  One Hemovac.   TOURNIQUET TIME:  39 minutes and 300 mmHg.   CONSULTATIONS:  Medical service, Geoffry Paradise, M.D.   BRIEF HISTORY:  Kimberly Baker is a 77 year old female with end-stage  osteoarthritis of the right knee with intractable pain.  She has had two  arthroscopic procedures which only gave her temporary relief.  She has had  multiple injections.  This has been interfering with what she can and cannot  do, and she now presents for a total knee arthroplasty.   LABORATORY DATA:  Hemoglobin on admission 11.6, hematocrit 32.8,  differential within normal limits.  Postoperative hemoglobin down to 8.8,  drifted down further to 7.9, given blood, post-transfusion hemoglobin 9.9,  hematocrit 28.9.  PT/PTT preoperatively 12.8 and 32, respectively, with an  INR of 1.  Last noted PT and INR 18 and 1.7.  Chemistry panel on admission:  Elevated glucose of 296, remaining study within normal limits.  Serial BMETs  were followed.  Sodium dropped from  138 to 132, back up to 136.  Glucose  came down from 296 to 223, and last noted at 231.  Urinalysis positive for  glucose, otherwise negative.  Blood type O positive.  Two-view chest on  March 29, 2004:  No acute cardiopulmonary findings, stable overall  appearance of the chest since December 19, 2002.   HOSPITAL COURSE:  The patient was admitted to [facility], taken to  the OR, and underwent the above-stated procedure without complication.  The  patient tolerated the procedure well and was taken to the recovery room and  then the orthopedic floor.  The patient had a fair amount of pain control  problems through that night and into day #1.  Dr. Jacky Kindle was called for  consult, encouraged straight leg raises.  The patient had a drop in  hemoglobin postoperatively down to 8.8.  She was asymptomatic when monitored  for symptoms.  Potassium was low at 3.5.  She was given potassium  supplements.  Knee immobilizer was discontinued.  The patient was seen in  consultation by Dr. Jacky Kindle  and followed for medical assistant.  By day #2,  the hemoglobin dropped down further, down to 7.8, and felt due to the blood  loss anemia, she would improve after transfusion.  Transfusion was  recommended, and she was given to units of blood, and the patient responded  well.  Diabetic medications were adjusted as per Dr. Jacky Kindle on a  postoperative basis.  From a therapy standpoint, the patient was up  ambulating approximately 60 feet by day #2 and then got up to 200 feet by  day #3, and was doing really well.  Pain was improving.  She was feeling  much better, and even by day #4, she was doing quite well.  The patient was  discharged home.   DISCHARGE PLAN:  1.  The patient was discharged home on April 16, 2004..  2.  Discharge medications:  Percocet, Valium, Coumadin.  3.  Diet:  Diabetic diet.  4.  Activity:  Weightbear as tolerated on the total knee protocol.  5.  Followup:  Two weeks from  surgery, call the office and make an      appointment at [phone].   DISPOSITION:  Home.   CONDITION ON DISCHARGE:  Improved.      ALP/MEDQ  D:  06/16/2004  T:  06/16/2004  Job:  [id number]   cc:   Geoffry Paradise, M.D.  [address]  [place]  [place] [address]  Fax: [phone]

## 2010-06-25 NOTE — Procedures (Signed)
[facility]  Patient:    MALLISA, ALAMEDA                       MRN: [id number] Proc. Date: 09/23/99 Adm. Date:  [id number] Attending:  Orland Mustard CC:         Sheppard Plumber. Earlene Plater, M.D.  Liam Graham, M.D.   Procedure Report  PROCEDURE PERFORMED:  Colonoscopy and polypectomy.  ENDOSCOPIST:  Llana Aliment. Randa Evens, M.D.  MEDICATIONS:  Fentanyl l87.5 mcg and Versed 2 mg IV.  SCOPE:  Adult Olympus video colonoscope.  INDICATIONS:  This is done as a follow up colonoscopy.  The patient has had a previous history of colon polyps with negative colonoscopy.  This is done as a five year followup.  DESCRIPTION OF PROCEDURE:  The procedure has been explained to the patient and consent obtained.  With the patient in the left lateral decubitus position, the adult video colonoscope is inserted and advanced under direct visualization.  The prep was quite good.  We were able to reach the cecum without difficulty.  The ileocecal valve was seen as was the appendiceal orifice.  The scope was withdrawn.  In the cecum, ascending colon, hepatic flexure, transverse colon, splenic flexure, descending and sigmoid colon were seen well upon removal.  No polyps were seen or diverticular disease.  The scope was withdrawn down into the rectum and there was a 1.5 cm polyp that was snared and sucked through the scope.  Two other small polyps were cauterized. They were all placed in a single jar.  The scope was withdrawn.  The patient tolerated the procedure well.  ASSESSMENT:  Rectal polyps removed.  PLAN: 1. We will check pathology and recommend repeating procedure in    three years. 2. Routine post polypectomy instructions. DD:  09/23/99 TD:  09/24/99 Job: [id number]

## 2010-08-02 ENCOUNTER — Other Ambulatory Visit: Payer: Self-pay | Admitting: Otolaryngology

## 2010-08-02 DIAGNOSIS — J329 Chronic sinusitis, unspecified: Secondary | ICD-10-CM

## 2010-08-17 ENCOUNTER — Ambulatory Visit
Admission: RE | Admit: 2010-08-17 | Discharge: 2010-08-17 | Disposition: A | Discharge status: Home / Self Care | Payer: Medicare Other | Source: Ambulatory Visit | Attending: Otolaryngology | Admitting: Otolaryngology

## 2010-08-17 DIAGNOSIS — J329 Chronic sinusitis, unspecified: Secondary | ICD-10-CM

## 2011-04-27 ENCOUNTER — Ambulatory Visit
Admission: RE | Admit: 2011-04-27 | Discharge: 2011-04-27 | Disposition: A | Discharge status: Home / Self Care | Payer: Medicare Other | Source: Ambulatory Visit | Attending: Anesthesiology | Admitting: Anesthesiology

## 2011-04-27 ENCOUNTER — Other Ambulatory Visit: Payer: Self-pay | Admitting: Anesthesiology

## 2011-04-27 DIAGNOSIS — M545 Low back pain: Secondary | ICD-10-CM

## 2011-04-27 DIAGNOSIS — M5410 Radiculopathy, site unspecified: Secondary | ICD-10-CM

## 2011-12-28 ENCOUNTER — Other Ambulatory Visit: Payer: Self-pay | Admitting: Neurosurgery

## 2011-12-28 DIAGNOSIS — IMO0002 Reserved for concepts with insufficient information to code with codable children: Secondary | ICD-10-CM

## 2011-12-29 ENCOUNTER — Ambulatory Visit
Admission: RE | Admit: 2011-12-29 | Discharge: 2011-12-29 | Disposition: A | Discharge status: Home / Self Care | Payer: Medicare Other | Source: Ambulatory Visit | Attending: Neurosurgery | Admitting: Neurosurgery

## 2011-12-29 DIAGNOSIS — IMO0002 Reserved for concepts with insufficient information to code with codable children: Secondary | ICD-10-CM

## 2012-01-09 ENCOUNTER — Other Ambulatory Visit: Payer: Medicare Other

## 2012-01-10 ENCOUNTER — Other Ambulatory Visit: Payer: Self-pay | Admitting: Neurosurgery

## 2012-01-19 ENCOUNTER — Encounter (HOSPITAL_COMMUNITY): Payer: Self-pay | Admitting: Pharmacy Technician

## 2012-01-24 ENCOUNTER — Encounter (HOSPITAL_COMMUNITY): Payer: Self-pay

## 2012-01-24 ENCOUNTER — Encounter (HOSPITAL_COMMUNITY)
Admission: RE | Admit: 2012-01-24 | Discharge: 2012-01-24 | Disposition: A | Discharge status: Home / Self Care | Payer: Medicare Other | Source: Ambulatory Visit | Attending: Anesthesiology | Admitting: Anesthesiology

## 2012-01-24 ENCOUNTER — Encounter (HOSPITAL_COMMUNITY)
Admission: RE | Admit: 2012-01-24 | Discharge: 2012-01-24 | Disposition: A | Discharge status: Home / Self Care | Payer: Medicare Other | Source: Ambulatory Visit | Attending: Neurosurgery | Admitting: Neurosurgery

## 2012-01-24 HISTORY — DX: Essential (primary) hypertension: I10

## 2012-01-24 HISTORY — DX: Chronic kidney disease, unspecified: N18.9

## 2012-01-24 HISTORY — DX: Hypothyroidism, unspecified: E03.9

## 2012-01-24 HISTORY — DX: Gastro-esophageal reflux disease without esophagitis: K21.9

## 2012-01-24 HISTORY — DX: Polyneuropathy, unspecified: G62.9

## 2012-01-24 HISTORY — DX: Other specified postprocedural states: Z98.890

## 2012-01-24 HISTORY — DX: Nausea with vomiting, unspecified: R11.2

## 2012-01-24 HISTORY — DX: Anemia, unspecified: D64.9

## 2012-01-24 HISTORY — DX: Unspecified osteoarthritis, unspecified site: M19.90

## 2012-01-24 HISTORY — DX: Type 2 diabetes mellitus without complications: E11.9

## 2012-01-24 LAB — CBC
HCT: 35.3 % — ABNORMAL LOW (ref 36.0–46.0)
MCH: 30.7 pg (ref 26.0–34.0)
MCV: 92.7 fL (ref 78.0–100.0)
Platelets: 319 10*3/uL (ref 150–400)
RBC: 3.81 MIL/uL — ABNORMAL LOW (ref 3.87–5.11)
RDW: 12.7 % (ref 11.5–15.5)
WBC: 12.7 10*3/uL — ABNORMAL HIGH (ref 4.0–10.5)

## 2012-01-24 LAB — BASIC METABOLIC PANEL
CO2: 28 mEq/L (ref 19–32)
Calcium: 9.6 mg/dL (ref 8.4–10.5)
Chloride: 96 mEq/L (ref 96–112)
Creatinine, Ser: 1.53 mg/dL — ABNORMAL HIGH (ref 0.50–1.10)
Glucose, Bld: 249 mg/dL — ABNORMAL HIGH (ref 70–99)

## 2012-01-24 NOTE — Progress Notes (Signed)
Patient stated had stress test prior to ear surgery.just as a prop study. Normal.unable to find results in hx

## 2012-01-24 NOTE — Pre-Procedure Instructions (Addendum)
[address]  01/24/2012   Your procedure is scheduled on:  12.23.13  Report to [facility] at 530 AM.  Call this number if you have problems the morning of surgery: [phone]   Remember:   Do not eat food or drink:After Midnight.    Take these medicines the morning of surgery with A SIP OF WATER: cymbalta, carvedilol, levothyroxine, singulair, omeprazole, topamax if needed STOP aspirin fish oil   Do not wear jewelry, make-up or nail polish.  Do not wear lotions, powders, or perfumes. You may not wear deodorant.  Do not shave 48 hours prior to surgery. Men may shave face and neck.  Do not bring valuables to the hospital.  Contacts, dentures or bridgework may not be worn into surgery.  Leave suitcase in the car. After surgery it may be brought to your room.  For patients admitted to the hospital, checkout time is 11:00 AM the day of discharge.   Patients discharged the day of surgery will not be allowed to drive home.  Name and phone number of your driver: dtr Thomasena Edis [phone]  Special Instructions: Shower using CHG 2 nights before surgery and the night before surgery.  If you shower the day of surgery use CHG.  Use special wash - you have one bottle of CHG for all showers.  You should use approximately 1/3 of the bottle for each shower.   Please read over the following fact sheets that you were given: Pain Booklet, Coughing and Deep Breathing, MRSA Information and Surgical Site Infection Prevention

## 2012-01-29 MED ORDER — CEFAZOLIN SODIUM-DEXTROSE 2-3 GM-% IV SOLR
2.0000 g | INTRAVENOUS | Status: AC
  Administered 2012-01-30: 2 g via INTRAVENOUS
  Filled 2012-01-29: qty 50

## 2012-01-29 NOTE — H&P (Signed)
Kimberly Baker  #[id number]  DOB:  08-Oct-1933    01/09/2012:  Kimberly Baker returns today to review her MRI and x-rays.  She has severe central stenosis at L4-5 worsened on the prior examination due to a combination of broad-based posterior disc protrusion, endplate osteophyte and posterior ligamentous thickening.  There is facet arthropathy bilaterally with obliteration of both lateral recesses with descending L5 nerve roots with severe right foraminal stenosis without left foraminal stenosis.  At L3-4 she has disc desiccation, shallow bulging and small central disc protrusion with mild central stenosis.  At L5-S1 she has severe disc degeneration without significant spinal stenosis.  She also has progression disc degeneration and endplate changes at the L1-2 level without changes and stenosis at this level.    Based on my discussion with the patient and review of her complaints, I believe that her most significant findings relate to the spinal stenosis at the L4-5 level which is really quite severe and I have recommended that she undergo lumbar laminectomy for spinal stenosis at the L4-5 level.  I reviewed the studies with the patient and went over her physical examination.  I reviewed surgical models and discussed the typical hospital course and operative and postoperative course and the potential risks and benefits of surgery.  The risks of surgery were discussed in detail and include, but are not limited to, the risks of anesthesia, blood loss and the possibility of hemorrhage, infection, damage to nerves, damage to blood vessels, injury to the lumbar nerve root causing either temporary or permanent leg pain, numbness, weakness.  There is potential for spinal fluid leak from dural tear.  There is the potential for post-laminectomy spondylolisthesis, recurrent disc ruptured quoted at approximately 10%, failure to relieve pain, worsening of pain, need for further surgery.  I do not believe that she needs surgical  intervention regarding other disc levels.  We will plan on performing surgery on 01/31/2012.  Risks and benefits were discussed in detail with the patient and she wishes to proceed.          Danae Orleans. Venetia Maxon, M.D./sv      NEUROSURGICAL CONSULTATION   Kimberly Baker   DOB:  Oct 02, 1933 #[id number]    May 23, 2011   HISTORY:     Kimberly Baker comes today with a lady which I believe is her daughter, complaining not of any neck pain, but mostly lower back pain. It seems that she has been seen by Dr. Ollen Bowl. He did three epidural injections.  She also had an MRI of the lumbar spine.  The pain is mostly going to the right side, all the way down to the foot, not to the left, and associated with walking and sitting.  She denies any weakness per se.  She tells me that right now she is feeling much better.    PAST MEDICAL HISTORY:  Ear surgery, knee replacement, cervical fusion.  She is ALLERGIC TO COMPAZINE.    FAMILY MEDICAL HISTORY:  Unremarkable.   SOCIAL HISTORY:    Negative.    REVIEW OF SYSTEMS:   Positive for anemia, ringing in the ears, nasal congestion, shortness of breath, and diabetes.   PHYSICAL EXAMINATION:    . Neck - there is some decrease of flexibility with some pain with rotation and lateralization of the neck.  There is a scar anteriorly.      Marland Kitchen Respiratory - lungs clear.      . Cardiovascular - the heart has regular rate and rhythm to auscultation.  No murmurs are appreciated.  There is no extremity edema, cyanosis or clubbing.  There are palpable pedal pulses.    . Abdomen - soft, nontender, no hepatosplenomegaly appreciated or masses.  There are active bowel sounds.  No guarding or rebound.    . Extremities - normal pulses.   . Musculoskeletal Examination - Straight leg raising is positive bilaterally at about 60 degrees with a femoral stretch maneuver being positive bilaterally.      NEUROLOGICAL EXAMINATION: The patient is oriented to time, person and place and has good  recall of both recent and remote memory with normal attention span and concentration.  The patient speaks with clear and fluent speech and exhibits normal language function and appropriate fund of knowledge.    . Motor Examination - normal strength in the upper extremities and she has mild weakness of dorsiflexion of the left foot.      DIAGNOSTIC STUDIES:   The patient had an MRI which shows severe stenosis at the level of 4-5 and 5-1.    CLINICAL IMPRESSION:   Lumbar stenosis with foraminal narrowing, radiculopathy.   RECOMMENDATIONS:   I talked to both of them at length.  She does not want to have surgery. She wants to wait. She is going to talk to Dr. Ollen Bowl for more epidural injections and she told me she would like to have surgery only when the pain gets worse.  Nevertheless, on her way out I am going to do an AP and lateral x-ray of the lumbar spine just to be sure there is no abnormality with flexibility.   [facility]    Tanya Nones. Jeral Fruit, M.D.   EMB:aft cc: Dr. Ermalinda Barrios

## 2012-01-30 ENCOUNTER — Encounter (HOSPITAL_COMMUNITY): Payer: Self-pay | Admitting: *Deleted

## 2012-01-30 ENCOUNTER — Encounter (HOSPITAL_COMMUNITY): Payer: Self-pay | Admitting: Anesthesiology

## 2012-01-30 ENCOUNTER — Observation Stay (HOSPITAL_COMMUNITY)
Admission: RE | Admit: 2012-01-30 | Discharge: 2012-01-31 | Disposition: A | Discharge status: Home / Self Care | Payer: Medicare Other | Source: Ambulatory Visit | Attending: Neurosurgery | Admitting: Neurosurgery

## 2012-01-30 ENCOUNTER — Inpatient Hospital Stay (HOSPITAL_COMMUNITY): Payer: Medicare Other | Admitting: Anesthesiology

## 2012-01-30 ENCOUNTER — Observation Stay (HOSPITAL_COMMUNITY): Payer: Medicare Other

## 2012-01-30 ENCOUNTER — Encounter (HOSPITAL_COMMUNITY)
Admission: RE | Disposition: A | Discharge status: Home / Self Care | Payer: Self-pay | Source: Ambulatory Visit | Attending: Neurosurgery

## 2012-01-30 DIAGNOSIS — Z01810 Encounter for preprocedural cardiovascular examination: Secondary | ICD-10-CM | POA: Insufficient documentation

## 2012-01-30 DIAGNOSIS — I10 Essential (primary) hypertension: Secondary | ICD-10-CM | POA: Insufficient documentation

## 2012-01-30 DIAGNOSIS — M48061 Spinal stenosis, lumbar region without neurogenic claudication: Secondary | ICD-10-CM | POA: Insufficient documentation

## 2012-01-30 DIAGNOSIS — Z01818 Encounter for other preprocedural examination: Secondary | ICD-10-CM | POA: Insufficient documentation

## 2012-01-30 DIAGNOSIS — E119 Type 2 diabetes mellitus without complications: Secondary | ICD-10-CM | POA: Insufficient documentation

## 2012-01-30 DIAGNOSIS — Z79899 Other long term (current) drug therapy: Secondary | ICD-10-CM | POA: Insufficient documentation

## 2012-01-30 DIAGNOSIS — Z01812 Encounter for preprocedural laboratory examination: Secondary | ICD-10-CM | POA: Insufficient documentation

## 2012-01-30 DIAGNOSIS — M5137 Other intervertebral disc degeneration, lumbosacral region: Secondary | ICD-10-CM | POA: Insufficient documentation

## 2012-01-30 DIAGNOSIS — M51379 Other intervertebral disc degeneration, lumbosacral region without mention of lumbar back pain or lower extremity pain: Secondary | ICD-10-CM | POA: Insufficient documentation

## 2012-01-30 DIAGNOSIS — Z794 Long term (current) use of insulin: Secondary | ICD-10-CM | POA: Insufficient documentation

## 2012-01-30 DIAGNOSIS — M47817 Spondylosis without myelopathy or radiculopathy, lumbosacral region: Principal | ICD-10-CM | POA: Insufficient documentation

## 2012-01-30 HISTORY — PX: LUMBAR LAMINECTOMY/DECOMPRESSION MICRODISCECTOMY: SHX5026

## 2012-01-30 LAB — GLUCOSE, CAPILLARY
Glucose-Capillary: 169 mg/dL — ABNORMAL HIGH (ref 70–99)
Glucose-Capillary: 205 mg/dL — ABNORMAL HIGH (ref 70–99)
Glucose-Capillary: 110 mg/dL — ABNORMAL HIGH (ref 70–99)
Glucose-Capillary: 197 mg/dL — ABNORMAL HIGH (ref 70–99)

## 2012-01-30 SURGERY — LUMBAR LAMINECTOMY/DECOMPRESSION MICRODISCECTOMY 1 LEVEL
Anesthesia: General | Site: Back | Laterality: Bilateral | Wound class: Clean

## 2012-01-30 MED ORDER — NEOSTIGMINE METHYLSULFATE 1 MG/ML IJ SOLN
INTRAMUSCULAR | Status: DC | PRN
  Administered 2012-01-30: 3 mg via INTRAVENOUS

## 2012-01-30 MED ORDER — ACETAMINOPHEN 325 MG PO TABS: 650.0000 mg | ORAL_TABLET | ORAL | Status: DC | PRN

## 2012-01-30 MED ORDER — FENOFIBRATE 160 MG PO TABS
160.0000 mg | ORAL_TABLET | Freq: Every day | ORAL | Status: DC
  Administered 2012-01-30: 160 mg via ORAL
  Filled 2012-01-30 (×2): qty 1

## 2012-01-30 MED ORDER — HYDROMORPHONE HCL PF 1 MG/ML IJ SOLN
INTRAMUSCULAR | Status: AC
  Filled 2012-01-30: qty 1

## 2012-01-30 MED ORDER — FLEET ENEMA 7-19 GM/118ML RE ENEM
1.0000 | ENEMA | Freq: Once | RECTAL | Status: AC | PRN
  Filled 2012-01-30: qty 1

## 2012-01-30 MED ORDER — LIDOCAINE-EPINEPHRINE 1 %-1:100000 IJ SOLN
INTRAMUSCULAR | Status: DC | PRN
  Administered 2012-01-30: 5 mL

## 2012-01-30 MED ORDER — DEXAMETHASONE SODIUM PHOSPHATE 4 MG/ML IJ SOLN
INTRAMUSCULAR | Status: DC | PRN
  Administered 2012-01-30: 4 mg via INTRAVENOUS

## 2012-01-30 MED ORDER — ONDANSETRON HCL 4 MG/2ML IJ SOLN: 4.0000 mg | Freq: Four times a day (QID) | INTRAMUSCULAR | Status: DC | PRN

## 2012-01-30 MED ORDER — CALCIUM CARBONATE 1250 (500 CA) MG PO TABS
1.0000 | ORAL_TABLET | Freq: Two times a day (BID) | ORAL | Status: DC
  Administered 2012-01-30 – 2012-01-31 (×2): 500 mg via ORAL
  Filled 2012-01-30 (×4): qty 1

## 2012-01-30 MED ORDER — ASPIRIN EC 81 MG PO TBEC
81.0000 mg | DELAYED_RELEASE_TABLET | Freq: Every day | ORAL | Status: DC
  Administered 2012-01-30: 81 mg via ORAL
  Filled 2012-01-30 (×2): qty 1

## 2012-01-30 MED ORDER — POLYETHYLENE GLYCOL 3350 17 G PO PACK
17.0000 g | PACK | Freq: Every day | ORAL | Status: DC | PRN
  Filled 2012-01-30: qty 1

## 2012-01-30 MED ORDER — PHENYLEPHRINE HCL 10 MG/ML IJ SOLN
10.0000 mg | INTRAVENOUS | Status: DC | PRN
  Administered 2012-01-30: 10 ug/min via INTRAVENOUS

## 2012-01-30 MED ORDER — CARVEDILOL 12.5 MG PO TABS
12.5000 mg | ORAL_TABLET | Freq: Two times a day (BID) | ORAL | Status: DC
  Administered 2012-01-30 – 2012-01-31 (×2): 12.5 mg via ORAL
  Filled 2012-01-30 (×4): qty 1

## 2012-01-30 MED ORDER — SODIUM CHLORIDE 0.9 % IR SOLN
Status: DC | PRN
  Administered 2012-01-30: 08:00:00

## 2012-01-30 MED ORDER — OXYCODONE HCL 5 MG PO TABS: 5.0000 mg | ORAL_TABLET | Freq: Once | ORAL | Status: DC | PRN

## 2012-01-30 MED ORDER — SENNA 8.6 MG PO TABS
1.0000 | ORAL_TABLET | Freq: Two times a day (BID) | ORAL | Status: DC
  Administered 2012-01-30: 8.6 mg via ORAL
  Filled 2012-01-30 (×3): qty 1

## 2012-01-30 MED ORDER — INSULIN ASPART 100 UNIT/ML ~~LOC~~ SOLN: 0.0000 [IU] | Freq: Every day | SUBCUTANEOUS | Status: DC

## 2012-01-30 MED ORDER — 0.9 % SODIUM CHLORIDE (POUR BTL) OPTIME
TOPICAL | Status: DC | PRN
  Administered 2012-01-30: 1000 mL

## 2012-01-30 MED ORDER — ARTIFICIAL TEARS OP OINT
TOPICAL_OINTMENT | OPHTHALMIC | Status: DC | PRN
  Administered 2012-01-30: 1 via OPHTHALMIC

## 2012-01-30 MED ORDER — ONDANSETRON HCL 4 MG/2ML IJ SOLN
INTRAMUSCULAR | Status: DC | PRN
  Administered 2012-01-30: 4 mg via INTRAVENOUS

## 2012-01-30 MED ORDER — DIAZEPAM 5 MG PO TABS
5.0000 mg | ORAL_TABLET | Freq: Four times a day (QID) | ORAL | Status: DC | PRN
  Administered 2012-01-30 – 2012-01-31 (×3): 5 mg via ORAL
  Filled 2012-01-30 (×3): qty 1

## 2012-01-30 MED ORDER — PHENOL 1.4 % MT LIQD: 1.0000 | OROMUCOSAL | Status: DC | PRN

## 2012-01-30 MED ORDER — LACTATED RINGERS IV SOLN
INTRAVENOUS | Status: DC | PRN
  Administered 2012-01-30 (×2): via INTRAVENOUS

## 2012-01-30 MED ORDER — LORATADINE 10 MG PO TABS
10.0000 mg | ORAL_TABLET | Freq: Every day | ORAL | Status: DC
  Administered 2012-01-30: 10 mg via ORAL
  Filled 2012-01-30 (×2): qty 1

## 2012-01-30 MED ORDER — THROMBIN 5000 UNITS EX KIT
PACK | CUTANEOUS | Status: DC | PRN
  Administered 2012-01-30 (×2): 5000 [IU] via TOPICAL

## 2012-01-30 MED ORDER — INSULIN ASPART 100 UNIT/ML ~~LOC~~ SOLN
0.0000 [IU] | Freq: Three times a day (TID) | SUBCUTANEOUS | Status: DC
Start: 2012-01-30 — End: 2012-01-31
  Administered 2012-01-30: 3 [IU] via SUBCUTANEOUS
  Administered 2012-01-30: 5 [IU] via SUBCUTANEOUS

## 2012-01-30 MED ORDER — ONDANSETRON HCL 4 MG/2ML IJ SOLN: 4.0000 mg | INTRAMUSCULAR | Status: DC | PRN

## 2012-01-30 MED ORDER — GLYCOPYRROLATE 0.2 MG/ML IJ SOLN
INTRAMUSCULAR | Status: DC | PRN
  Administered 2012-01-30: .4 mg via INTRAVENOUS

## 2012-01-30 MED ORDER — ACETAMINOPHEN 10 MG/ML IV SOLN
INTRAVENOUS | Status: DC | PRN
  Administered 2012-01-30: 1000 mg via INTRAVENOUS

## 2012-01-30 MED ORDER — FENTANYL CITRATE 0.05 MG/ML IJ SOLN
INTRAMUSCULAR | Status: DC | PRN
  Administered 2012-01-30: 50 ug via INTRAVENOUS
  Administered 2012-01-30: 75 ug via INTRAVENOUS
  Administered 2012-01-30: 25 ug via INTRAVENOUS

## 2012-01-30 MED ORDER — TOPIRAMATE 100 MG PO TABS
100.0000 mg | ORAL_TABLET | Freq: Two times a day (BID) | ORAL | Status: DC
  Administered 2012-01-30 (×2): 100 mg via ORAL
  Filled 2012-01-30 (×4): qty 1

## 2012-01-30 MED ORDER — ACETAMINOPHEN 650 MG RE SUPP: 650.0000 mg | RECTAL | Status: DC | PRN

## 2012-01-30 MED ORDER — OXYCODONE HCL 5 MG/5ML PO SOLN: 5.0000 mg | Freq: Once | ORAL | Status: DC | PRN

## 2012-01-30 MED ORDER — CEFAZOLIN SODIUM 1-5 GM-% IV SOLN
1.0000 g | Freq: Three times a day (TID) | INTRAVENOUS | Status: AC
  Administered 2012-01-30 (×2): 1 g via INTRAVENOUS
  Filled 2012-01-30 (×2): qty 50

## 2012-01-30 MED ORDER — PANTOPRAZOLE SODIUM 40 MG PO TBEC: 40.0000 mg | DELAYED_RELEASE_TABLET | Freq: Every day | ORAL | Status: DC

## 2012-01-30 MED ORDER — DOCUSATE SODIUM 100 MG PO CAPS
100.0000 mg | ORAL_CAPSULE | Freq: Two times a day (BID) | ORAL | Status: DC
  Administered 2012-01-30: 100 mg via ORAL
  Filled 2012-01-30: qty 1

## 2012-01-30 MED ORDER — BACITRACIN 50000 UNITS IM SOLR
INTRAMUSCULAR | Status: AC
  Filled 2012-01-30: qty 1

## 2012-01-30 MED ORDER — ESTRADIOL 1 MG PO TABS
1.0000 mg | ORAL_TABLET | Freq: Every day | ORAL | Status: DC
  Administered 2012-01-30: 1 mg via ORAL
  Filled 2012-01-30 (×2): qty 1

## 2012-01-30 MED ORDER — SIMVASTATIN 5 MG PO TABS
5.0000 mg | ORAL_TABLET | Freq: Every day | ORAL | Status: DC
  Administered 2012-01-30: 5 mg via ORAL
  Filled 2012-01-30 (×2): qty 1

## 2012-01-30 MED ORDER — ZOLPIDEM TARTRATE 5 MG PO TABS
5.0000 mg | ORAL_TABLET | Freq: Every evening | ORAL | Status: DC | PRN
  Administered 2012-01-30: 5 mg via ORAL
  Filled 2012-01-30: qty 1

## 2012-01-30 MED ORDER — KCL IN DEXTROSE-NACL 20-5-0.45 MEQ/L-%-% IV SOLN
INTRAVENOUS | Status: DC
  Filled 2012-01-30 (×3): qty 1000

## 2012-01-30 MED ORDER — HYDROMORPHONE HCL PF 1 MG/ML IJ SOLN
0.2500 mg | INTRAMUSCULAR | Status: DC | PRN
  Administered 2012-01-30 (×2): 0.5 mg via INTRAVENOUS

## 2012-01-30 MED ORDER — ROCURONIUM BROMIDE 100 MG/10ML IV SOLN
INTRAVENOUS | Status: DC | PRN
  Administered 2012-01-30: 50 mg via INTRAVENOUS

## 2012-01-30 MED ORDER — BISACODYL 10 MG RE SUPP: 10.0000 mg | Freq: Every day | RECTAL | Status: DC | PRN

## 2012-01-30 MED ORDER — ACETAMINOPHEN 10 MG/ML IV SOLN
INTRAVENOUS | Status: AC
  Filled 2012-01-30: qty 100

## 2012-01-30 MED ORDER — HEMOSTATIC AGENTS (NO CHARGE) OPTIME
TOPICAL | Status: DC | PRN
  Administered 2012-01-30: 1 via TOPICAL

## 2012-01-30 MED ORDER — FUROSEMIDE 40 MG PO TABS
40.0000 mg | ORAL_TABLET | Freq: Every day | ORAL | Status: DC
  Administered 2012-01-30: 40 mg via ORAL
  Filled 2012-01-30 (×2): qty 1

## 2012-01-30 MED ORDER — INSULIN GLARGINE 100 UNIT/ML ~~LOC~~ SOLN
12.0000 [IU] | Freq: Every day | SUBCUTANEOUS | Status: DC
  Administered 2012-01-30: 12 [IU] via SUBCUTANEOUS

## 2012-01-30 MED ORDER — HYDROCODONE-ACETAMINOPHEN 5-325 MG PO TABS
1.0000 | ORAL_TABLET | ORAL | Status: DC | PRN
  Administered 2012-01-30: 1 via ORAL
  Filled 2012-01-30: qty 1

## 2012-01-30 MED ORDER — MORPHINE SULFATE 2 MG/ML IJ SOLN: 1.0000 mg | INTRAMUSCULAR | Status: DC | PRN

## 2012-01-30 MED ORDER — SODIUM CHLORIDE 0.9 % IV SOLN
INTRAVENOUS | Status: AC
  Filled 2012-01-30: qty 500

## 2012-01-30 MED ORDER — SODIUM CHLORIDE 0.9 % IJ SOLN: 3.0000 mL | INTRAMUSCULAR | Status: DC | PRN

## 2012-01-30 MED ORDER — SCOPOLAMINE 1 MG/3DAYS TD PT72
MEDICATED_PATCH | TRANSDERMAL | Status: DC | PRN
  Administered 2012-01-30: 1 via TRANSDERMAL

## 2012-01-30 MED ORDER — SCOPOLAMINE 1 MG/3DAYS TD PT72
MEDICATED_PATCH | TRANSDERMAL | Status: AC
  Filled 2012-01-30: qty 1

## 2012-01-30 MED ORDER — CALCIUM CARBONATE 600 MG PO TABS
600.0000 mg | ORAL_TABLET | Freq: Two times a day (BID) | ORAL | Status: DC
  Filled 2012-01-30 (×2): qty 1

## 2012-01-30 MED ORDER — MENTHOL 3 MG MT LOZG: 1.0000 | LOZENGE | OROMUCOSAL | Status: DC | PRN

## 2012-01-30 MED ORDER — OXYCODONE-ACETAMINOPHEN 5-325 MG PO TABS
1.0000 | ORAL_TABLET | ORAL | Status: DC | PRN
  Administered 2012-01-30 – 2012-01-31 (×3): 1 via ORAL
  Filled 2012-01-30 (×3): qty 1

## 2012-01-30 MED ORDER — LEVOTHYROXINE SODIUM 50 MCG PO TABS
50.0000 ug | ORAL_TABLET | Freq: Every day | ORAL | Status: DC
  Filled 2012-01-30 (×2): qty 1

## 2012-01-30 MED ORDER — BUPIVACAINE HCL (PF) 0.5 % IJ SOLN
INTRAMUSCULAR | Status: DC | PRN
  Administered 2012-01-30: 5 mL

## 2012-01-30 MED ORDER — DULOXETINE HCL 30 MG PO CPEP
30.0000 mg | ORAL_CAPSULE | Freq: Every day | ORAL | Status: DC
  Administered 2012-01-30: 30 mg via ORAL
  Filled 2012-01-30 (×2): qty 1

## 2012-01-30 MED ORDER — SODIUM CHLORIDE 0.9 % IJ SOLN
3.0000 mL | Freq: Two times a day (BID) | INTRAMUSCULAR | Status: DC
  Administered 2012-01-30 (×2): 3 mL via INTRAVENOUS

## 2012-01-30 MED ORDER — LIDOCAINE HCL (CARDIAC) 20 MG/ML IV SOLN
INTRAVENOUS | Status: DC | PRN
  Administered 2012-01-30: 40 mg via INTRAVENOUS

## 2012-01-30 MED ORDER — PROPOFOL 10 MG/ML IV BOLUS
INTRAVENOUS | Status: DC | PRN
  Administered 2012-01-30: 150 mg via INTRAVENOUS

## 2012-01-30 MED ORDER — MONTELUKAST SODIUM 10 MG PO TABS
10.0000 mg | ORAL_TABLET | Freq: Every day | ORAL | Status: DC
  Administered 2012-01-30: 10 mg via ORAL
  Filled 2012-01-30 (×2): qty 1

## 2012-01-30 SURGICAL SUPPLY — 58 items
ADH SKN CLS APL DERMABOND .7 (GAUZE/BANDAGES/DRESSINGS) ×1
BAG DECANTER FOR FLEXI CONT (MISCELLANEOUS) ×2 IMPLANT
BENZOIN TINCTURE PRP APPL 2/3 (GAUZE/BANDAGES/DRESSINGS) IMPLANT
BIT DRILL NEURO 2X3.1 SFT TUCH (MISCELLANEOUS) ×1 IMPLANT
BLADE SURG ROTATE 9660 (MISCELLANEOUS) IMPLANT
BUR ROUND FLUTED 5 RND (BURR) ×2 IMPLANT
CANISTER SUCTION 2500CC (MISCELLANEOUS) ×2 IMPLANT
CLOTH BEACON ORANGE TIMEOUT ST (SAFETY) ×2 IMPLANT
CONT SPEC 4OZ CLIKSEAL STRL BL (MISCELLANEOUS) ×2 IMPLANT
DERMABOND ADVANCED (GAUZE/BANDAGES/DRESSINGS) ×1
DERMABOND ADVANCED .7 DNX12 (GAUZE/BANDAGES/DRESSINGS) ×1 IMPLANT
DRAPE LAPAROTOMY 100X72X124 (DRAPES) ×2 IMPLANT
DRAPE MICROSCOPE LEICA (MISCELLANEOUS) IMPLANT
DRAPE POUCH INSTRU U-SHP 10X18 (DRAPES) ×2 IMPLANT
DRAPE SURG 17X23 STRL (DRAPES) ×2 IMPLANT
DRESSING TELFA 8X3 (GAUZE/BANDAGES/DRESSINGS) IMPLANT
DRILL NEURO 2X3.1 SOFT TOUCH (MISCELLANEOUS) ×2
DRSG OPSITE 4X5.5 SM (GAUZE/BANDAGES/DRESSINGS) ×2 IMPLANT
DURAPREP 26ML APPLICATOR (WOUND CARE) ×2 IMPLANT
ELECT REM PT RETURN 9FT ADLT (ELECTROSURGICAL) ×2
ELECTRODE REM PT RTRN 9FT ADLT (ELECTROSURGICAL) ×1 IMPLANT
GAUZE SPONGE 4X4 16PLY XRAY LF (GAUZE/BANDAGES/DRESSINGS) IMPLANT
GLOVE BIO SURGEON STRL SZ8 (GLOVE) ×2 IMPLANT
GLOVE BIOGEL PI IND STRL 7.0 (GLOVE) ×1 IMPLANT
GLOVE BIOGEL PI IND STRL 8 (GLOVE) ×1 IMPLANT
GLOVE BIOGEL PI IND STRL 8.5 (GLOVE) ×1 IMPLANT
GLOVE BIOGEL PI INDICATOR 7.0 (GLOVE) ×1
GLOVE BIOGEL PI INDICATOR 8 (GLOVE) ×1
GLOVE BIOGEL PI INDICATOR 8.5 (GLOVE) ×1
GLOVE ECLIPSE 8.0 STRL XLNG CF (GLOVE) ×2 IMPLANT
GLOVE EXAM NITRILE LRG STRL (GLOVE) IMPLANT
GLOVE EXAM NITRILE MD LF STRL (GLOVE) ×2 IMPLANT
GLOVE EXAM NITRILE XL STR (GLOVE) IMPLANT
GLOVE EXAM NITRILE XS STR PU (GLOVE) IMPLANT
GLOVE SURG SS PI 7.0 STRL IVOR (GLOVE) ×6 IMPLANT
GOWN BRE IMP SLV AUR LG STRL (GOWN DISPOSABLE) IMPLANT
GOWN BRE IMP SLV AUR XL STRL (GOWN DISPOSABLE) ×4 IMPLANT
GOWN STRL REIN 2XL LVL4 (GOWN DISPOSABLE) ×2 IMPLANT
KIT BASIN OR (CUSTOM PROCEDURE TRAY) ×2 IMPLANT
KIT ROOM TURNOVER OR (KITS) ×2 IMPLANT
NEEDLE HYPO 18GX1.5 BLUNT FILL (NEEDLE) IMPLANT
NEEDLE HYPO 25X1 1.5 SAFETY (NEEDLE) ×2 IMPLANT
NS IRRIG 1000ML POUR BTL (IV SOLUTION) ×2 IMPLANT
PACK LAMINECTOMY NEURO (CUSTOM PROCEDURE TRAY) ×2 IMPLANT
PAD ARMBOARD 7.5X6 YLW CONV (MISCELLANEOUS) ×6 IMPLANT
RUBBERBAND STERILE (MISCELLANEOUS) IMPLANT
SPONGE GAUZE 4X4 12PLY (GAUZE/BANDAGES/DRESSINGS) IMPLANT
SPONGE SURGIFOAM ABS GEL SZ50 (HEMOSTASIS) ×2 IMPLANT
STRIP CLOSURE SKIN 1/2X4 (GAUZE/BANDAGES/DRESSINGS) IMPLANT
SUT VIC AB 0 CT1 18XCR BRD8 (SUTURE) ×1 IMPLANT
SUT VIC AB 0 CT1 8-18 (SUTURE) ×1
SUT VIC AB 2-0 CT1 18 (SUTURE) ×2 IMPLANT
SUT VIC AB 3-0 SH 8-18 (SUTURE) ×2 IMPLANT
SYR 20ML ECCENTRIC (SYRINGE) ×2 IMPLANT
SYR 5ML LL (SYRINGE) IMPLANT
TOWEL OR 17X24 6PK STRL BLUE (TOWEL DISPOSABLE) ×2 IMPLANT
TOWEL OR 17X26 10 PK STRL BLUE (TOWEL DISPOSABLE) ×2 IMPLANT
WATER STERILE IRR 1000ML POUR (IV SOLUTION) ×2 IMPLANT

## 2012-01-30 NOTE — Anesthesia Preprocedure Evaluation (Signed)
Anesthesia Evaluation  Patient identified by MRN, date of birth, ID band Patient awake    Reviewed: Allergy & Precautions, H&P , NPO status , Patient's Chart, lab work & pertinent test results  History of Anesthesia Complications (+) PONV  Airway Mallampati: II  Neck ROM: full    Dental   Pulmonary          Cardiovascular hypertension,     Neuro/Psych    GI/Hepatic GERD-  ,  Endo/Other  diabetes, Type 2, Insulin DependentHypothyroidism   Renal/GU Renal InsufficiencyRenal disease     Musculoskeletal   Abdominal   Peds  Hematology   Anesthesia Other Findings   Reproductive/Obstetrics                           Anesthesia Physical Anesthesia Plan  ASA: III  Anesthesia Plan: General   Post-op Pain Management:    Induction: Intravenous  Airway Management Planned: Oral ETT  Additional Equipment:   Intra-op Plan:   Post-operative Plan: Extubation in OR  Informed Consent: I have reviewed the patients History and Physical, chart, labs and discussed the procedure including the risks, benefits and alternatives for the proposed anesthesia with the patient or authorized representative who has indicated his/her understanding and acceptance.     Plan Discussed with: CRNA and Surgeon  Anesthesia Plan Comments:         Anesthesia Quick Evaluation

## 2012-01-30 NOTE — Transfer of Care (Signed)
Immediate Anesthesia Transfer of Care Note  Patient: Kimberly Baker  Procedure(s) Performed: Procedure(s) (LRB) with comments: LUMBAR LAMINECTOMY/DECOMPRESSION MICRODISCECTOMY 1 LEVEL (Bilateral) - Lumbar four-five Decompression  Patient Location: PACU  Anesthesia Type:General  Level of Consciousness: awake, alert  and patient cooperative  Airway & Oxygen Therapy: Patient Spontanous Breathing and Patient connected to face mask oxygen  Post-op Assessment: Report given to PACU RN, Post -op Vital signs reviewed and stable, Patient moving all extremities and Patient moving all extremities X 4  Post vital signs: Reviewed and stable  Complications: No apparent anesthesia complications

## 2012-01-30 NOTE — Anesthesia Postprocedure Evaluation (Signed)
Anesthesia Post Note  Patient: Kimberly Baker  Procedure(s) Performed: Procedure(s) (LRB): LUMBAR LAMINECTOMY/DECOMPRESSION MICRODISCECTOMY 1 LEVEL (Bilateral)  Anesthesia type: General  Patient location: PACU  Post pain: Pain level controlled and Adequate analgesia  Post assessment: Post-op Vital signs reviewed, Patient's Cardiovascular Status Stable, Respiratory Function Stable, Patent Airway and Pain level controlled  Last Vitals:  Filed Vitals:   01/30/12 0903  BP:   Pulse:   Temp: 36.2 C  Resp:     Post vital signs: Reviewed and stable  Level of consciousness: awake, alert  and oriented  Complications: No apparent anesthesia complications

## 2012-01-30 NOTE — Brief Op Note (Signed)
01/30/2012  9:22 AM  PATIENT:  Kimberly Baker  78 y.o. female  PRE-OPERATIVE DIAGNOSIS:  Lumbar four-five stenosis,spondylosis, degenerative disc disease, radiculopathy  POST-OPERATIVE DIAGNOSIS:   Lumbar four-five stenosis,spondylosis, degenerative disc disease, radiculopathy   PROCEDURE:  Procedure(s) (LRB) with comments: LUMBAR LAMINECTOMY/DECOMPRESSION MICRODISCECTOMY 1 LEVEL (Bilateral) - Lumbar four-five Decompression with decompression of bilateral L4, L5, S1 nerve roots (laminectomy of L4 and L5 levels)  SURGEON:  Surgeon(s) and Role:    * Bo Rogue, MD - Primary    * Kyle L Cabbell, MD - Assisting  PHYSICIAN ASSISTANT:   ASSISTANTS: None   ANESTHESIA:   general  EBL:  Total I/O In: 1200 [I.V.:1200] Out: 50 [Blood:50]  BLOOD ADMINISTERED:none  DRAINS: none   LOCAL MEDICATIONS USED:  LIDOCAINE   SPECIMEN:  No Specimen  DISPOSITION OF SPECIMEN:  N/A  COUNTS:  YES  TOURNIQUET:  * No tourniquets in log *  DICTATION: DICTATION: Patient has severe spinal stenosis at L4-5 with leg weakness and intractable pain. It was elected to take her to surgery for L4 through L5 laminectomy.  Procedure: Patient was brought to the operating room and following the smooth and uncomplicated induction of general endotracheal anesthesia she was placed in a prone position on the Wilson frame. Low back was prepped and draped in the usual sterile fashion with Betadine scrub and DuraPrep. Area of planned incision was infiltrated with local lidocaine. Incision was made in the midline and carried to the lumbodorsal fascia which was incised bilaterally. Subperiosteal dissection was performed exposing what was felt to be L45 level. Intraoperative x-ray demonstrated marker probes at L3-4 and L4-5 levels. Exposure was carried caudad to expose the L4-5 level.  A total laminectomy of L4 and L5 was performed with leksell, then a high-speed drill and completed with Kerrison rongeurs and generous  foraminotomies were performed to decompress the L4, L5, and S1 nerves bilaterally. Ligamentum flavum was detached and removed in a piecemeal fashion. Angled curettes were used to detatch and then remove thickened compressive ligamentous material.  At this point it was felt that all neural elements were well decompressed. The wound was then irrigated with bacitracin saline. Hemostasis was assured with bipolar electrocautery and the interspace was irrigated with saline. The lumbodorsal fascia was closed with 0 Vicryl sutures the subcutaneous tissues reapproximated 2-0 Vicryl inverted sutures and the skin edges were reapproximated with 3-0 Vicryl subcuticular stitch. The wound is dressed with Dermabond and a sterile occlusive dressing. Patient was extubated in the operating room and taken to recovery in stable and satisfactory condition having tolerated his operation well counts were correct at the end of the case.   PLAN OF CARE: Admit for overnight observation  PATIENT DISPOSITION:  PACU - hemodynamically stable.   Delay start of Pharmacological VTE agent (>24hrs) due to surgical blood loss or risk of bleeding: yes  

## 2012-01-30 NOTE — Op Note (Signed)
01/30/2012  9:22 AM  PATIENT:  Kimberly Baker  78 y.o. female  PRE-OPERATIVE DIAGNOSIS:  Lumbar four-five stenosis,spondylosis, degenerative disc disease, radiculopathy  POST-OPERATIVE DIAGNOSIS:   Lumbar four-five stenosis,spondylosis, degenerative disc disease, radiculopathy   PROCEDURE:  Procedure(s) (LRB) with comments: LUMBAR LAMINECTOMY/DECOMPRESSION MICRODISCECTOMY 1 LEVEL (Bilateral) - Lumbar four-five Decompression with decompression of bilateral L4, L5, S1 nerve roots (laminectomy of L4 and L5 levels)  SURGEON:  Surgeon(s) and Role:    * Maeola Harman, MD - Primary    * Carmela Hurt, MD - Assisting  PHYSICIAN ASSISTANT:   ASSISTANTS: None   ANESTHESIA:   general  EBL:  Total I/O In: 1200 [I.V.:1200] Out: 50 [Blood:50]  BLOOD ADMINISTERED:none  DRAINS: none   LOCAL MEDICATIONS USED:  LIDOCAINE   SPECIMEN:  No Specimen  DISPOSITION OF SPECIMEN:  N/A  COUNTS:  YES  TOURNIQUET:  * No tourniquets in log *  DICTATION: DICTATION: Patient has severe spinal stenosis at L4-5 with leg weakness and intractable pain. It was elected to take her to surgery for L4 through L5 laminectomy.  Procedure: Patient was brought to the operating room and following the smooth and uncomplicated induction of general endotracheal anesthesia she was placed in a prone position on the Wilson frame. Low back was prepped and draped in the usual sterile fashion with Betadine scrub and DuraPrep. Area of planned incision was infiltrated with local lidocaine. Incision was made in the midline and carried to the lumbodorsal fascia which was incised bilaterally. Subperiosteal dissection was performed exposing what was felt to be L45 level. Intraoperative x-ray demonstrated marker probes at L3-4 and L4-5 levels. Exposure was carried caudad to expose the L4-5 level.  A total laminectomy of L4 and L5 was performed with leksell, then a high-speed drill and completed with Kerrison rongeurs and generous  foraminotomies were performed to decompress the L4, L5, and S1 nerves bilaterally. Ligamentum flavum was detached and removed in a piecemeal fashion. Angled curettes were used to detatch and then remove thickened compressive ligamentous material.  At this point it was felt that all neural elements were well decompressed. The wound was then irrigated with bacitracin saline. Hemostasis was assured with bipolar electrocautery and the interspace was irrigated with saline. The lumbodorsal fascia was closed with 0 Vicryl sutures the subcutaneous tissues reapproximated 2-0 Vicryl inverted sutures and the skin edges were reapproximated with 3-0 Vicryl subcuticular stitch. The wound is dressed with Dermabond and a sterile occlusive dressing. Patient was extubated in the operating room and taken to recovery in stable and satisfactory condition having tolerated his operation well counts were correct at the end of the case.   PLAN OF CARE: Admit for overnight observation  PATIENT DISPOSITION:  PACU - hemodynamically stable.   Delay start of Pharmacological VTE agent (>24hrs) due to surgical blood loss or risk of bleeding: yes

## 2012-01-30 NOTE — Interval H&P Note (Signed)
History and Physical Interval Note:  01/30/2012 5:34 AM  Kimberly Baker  has presented today for surgery, with the diagnosis of Lumbar stenosis, Lumbar spondylosis  The various methods of treatment have been discussed with the patient and family. After consideration of risks, benefits and other options for treatment, the patient has consented to  Procedure(s) (LRB) with comments: LUMBAR LAMINECTOMY/DECOMPRESSION MICRODISCECTOMY 1 LEVEL (N/A) - L4-5 Decompression as a surgical intervention .  The patient's history has been reviewed, patient examined, no change in status, stable for surgery.  I have reviewed the patient's chart and labs.  Questions were answered to the patient's satisfaction.     Kimberly Baker  Date of Initial H&P: 01/30/2012  History reviewed, patient examined, no change in status, stable for surgery.

## 2012-01-30 NOTE — Progress Notes (Signed)
Awake, alert, conversant.  Improved pain.  Leg strength full.

## 2012-01-30 NOTE — Preoperative (Signed)
Beta Blockers   Reason not to administer Beta Blockers:Not Applicable 

## 2012-01-30 NOTE — Plan of Care (Signed)
Problem: Consults Goal: Diagnosis - Spinal Surgery Outcome: Completed/Met Date Met:  01/30/12 Lumbar Laminectomy (Complex)

## 2012-01-31 ENCOUNTER — Encounter (HOSPITAL_COMMUNITY): Payer: Self-pay | Admitting: Neurosurgery

## 2012-01-31 MED ORDER — ONDANSETRON HCL 4 MG PO TABS
4.0000 mg | ORAL_TABLET | Freq: Once | ORAL | Status: AC
  Administered 2012-01-31: 4 mg via ORAL
  Filled 2012-01-31: qty 1

## 2012-01-31 MED ORDER — OXYCODONE-ACETAMINOPHEN 5-325 MG PO TABS: 1.0000 | ORAL_TABLET | ORAL | Status: DC | PRN

## 2012-01-31 MED ORDER — DIAZEPAM 5 MG PO TABS: 5.0000 mg | ORAL_TABLET | Freq: Four times a day (QID) | ORAL | Status: DC | PRN

## 2012-01-31 NOTE — Discharge Summary (Signed)
Physician Discharge Summary  Patient ID: Kimberly Baker MRN: [id number] DOB/AGE: 1933/09/09 78 y.o.  Admit date: 01/30/2012 Discharge date: 01/31/2012  Admission Diagnoses:Lumbar spinal stenosis  Discharge Diagnoses: Same Active Problems:  * No active hospital problems. *    Discharged Condition: good  Hospital Course: Uncomplicated decompressive lumbar laminectomy for spinal stenosis L4-S1  Consults: None  Significant Diagnostic Studies: None  Treatments: surgery: Uncomplicated decompressive lumbar laminectomy for spinal stenosis L4-S1  Discharge Exam: Blood pressure 110/55, pulse 72, temperature 99.1 F (37.3 C), temperature source Oral, resp. rate 18, SpO2 96.00%. Neurologic: Alert and oriented X 3, normal strength and tone. Normal symmetric reflexes. Normal coordination and gait Wound:CDI  Disposition: Home     Medication List     As of 01/31/2012  7:52 AM    TAKE these medications         aspirin EC 81 MG tablet   Take 81 mg by mouth daily.      calcium carbonate 600 MG Tabs   Commonly known as: OS-CAL   Take 600 mg by mouth 2 (two) times daily with a meal.      carvedilol 12.5 MG tablet   Commonly known as: COREG   Take 12.5 mg by mouth 2 (two) times daily with a meal.      cetirizine 10 MG tablet   Commonly known as: ZYRTEC   Take 10 mg by mouth daily.      diazepam 5 MG tablet   Commonly known as: VALIUM   Take 1 tablet (5 mg total) by mouth every 6 (six) hours as needed.      DULoxetine 30 MG capsule   Commonly known as: CYMBALTA   Take 30 mg by mouth daily.      estradiol 1 MG tablet   Commonly known as: ESTRACE   Take 1 mg by mouth daily.      fenofibrate 160 MG tablet   Take 160 mg by mouth daily.      fish oil-omega-3 fatty acids 1000 MG capsule   Take 2 g by mouth 2 (two) times daily.      furosemide 40 MG tablet   Commonly known as: LASIX   Take 40 mg by mouth daily.      insulin glargine 100 UNIT/ML injection   Commonly  known as: LANTUS   Inject 12 Units into the skin at bedtime. Patient uses 12 units at 12 pm.      insulin lispro 100 UNIT/ML injection   Commonly known as: HUMALOG   Inject 4-10 Units into the skin 3 (three) times daily before meals.      levothyroxine 50 MCG tablet   Commonly known as: SYNTHROID, LEVOTHROID   Take 50 mcg by mouth daily.      montelukast 10 MG tablet   Commonly known as: SINGULAIR   Take 10 mg by mouth at bedtime.      omeprazole 20 MG capsule   Commonly known as: PRILOSEC   Take 20 mg by mouth daily.      oxyCODONE-acetaminophen 5-325 MG per tablet   Commonly known as: PERCOCET/ROXICET   Take 1-2 tablets by mouth every 4 (four) hours as needed for pain.      pravastatin 40 MG tablet   Commonly known as: PRAVACHOL   Take 40 mg by mouth daily.      topiramate 100 MG tablet   Commonly known as: TOPAMAX   Take 100 mg by mouth 2 (two) times daily.  Signed: Dorian Heckle, MD 01/31/2012, 7:52 AM

## 2012-01-31 NOTE — Progress Notes (Signed)
Subjective: Patient reports doing well  Objective: Vital signs in last 24 hours: Temp:  [97 F (36.1 C)-99.4 F (37.4 C)] 99.4 F (37.4 C) (12/24 0400) Pulse Rate:  [54-69] 69  (12/24 0400) Resp:  [10-20] 14  (12/24 0400) BP: (120-170)/(55-82) 126/56 mmHg (12/24 0400) SpO2:  [93 %-100 %] 94 % (12/24 0400)  Intake/Output from previous day: 12/23 0701 - 12/24 0700 In: 1560 [P.O.:360; I.V.:1200] Out: 50 [Blood:50] Intake/Output this shift:    Physical Exam: Full strength bilateral lower extremities.  Dressing CDI  Lab Results: No results found for this basename: WBC:2,HGB:2,HCT:2,PLT:2 in the last 72 hours BMET No results found for this basename: NA:2,K:2,CL:2,CO2:2,GLUCOSE:2,BUN:2,CREATININE:2,CALCIUM:2 in the last 72 hours  Studies/Results: Dg Lumbar Spine 1 View  01/30/2012  *RADIOLOGY REPORT*  Clinical Data: L4-L5 decompression.  LUMBAR SPINE - 1 VIEW  Comparison: Lumbar spine MRI 12/29/2011  Findings: Single lateral view of the lumbar spine was obtained. There are surgical markers along the posterior aspect of L4 and L5. There is marked disc space loss at L1-L2.  There is also disc space loss at L4-L5 and L5-S1.  IMPRESSION: Surgical marking at L4 and L5.   Original Report Authenticated By: Richarda Overlie, M.D.     Assessment/Plan: D/C Home.  Doing well.    LOS: 1 day    Dorian Heckle, MD 01/31/2012, 5:08 AM

## 2013-06-21 IMAGING — CR DG CHEST 2V
2 series · 2 of 2 positions shown · non-contrast
Comparison: 04/14/2010.

CLINICAL DATA: Preop for L4-5 decompression.

CHEST - 2 VIEW

[view not recorded (1 of 2)]
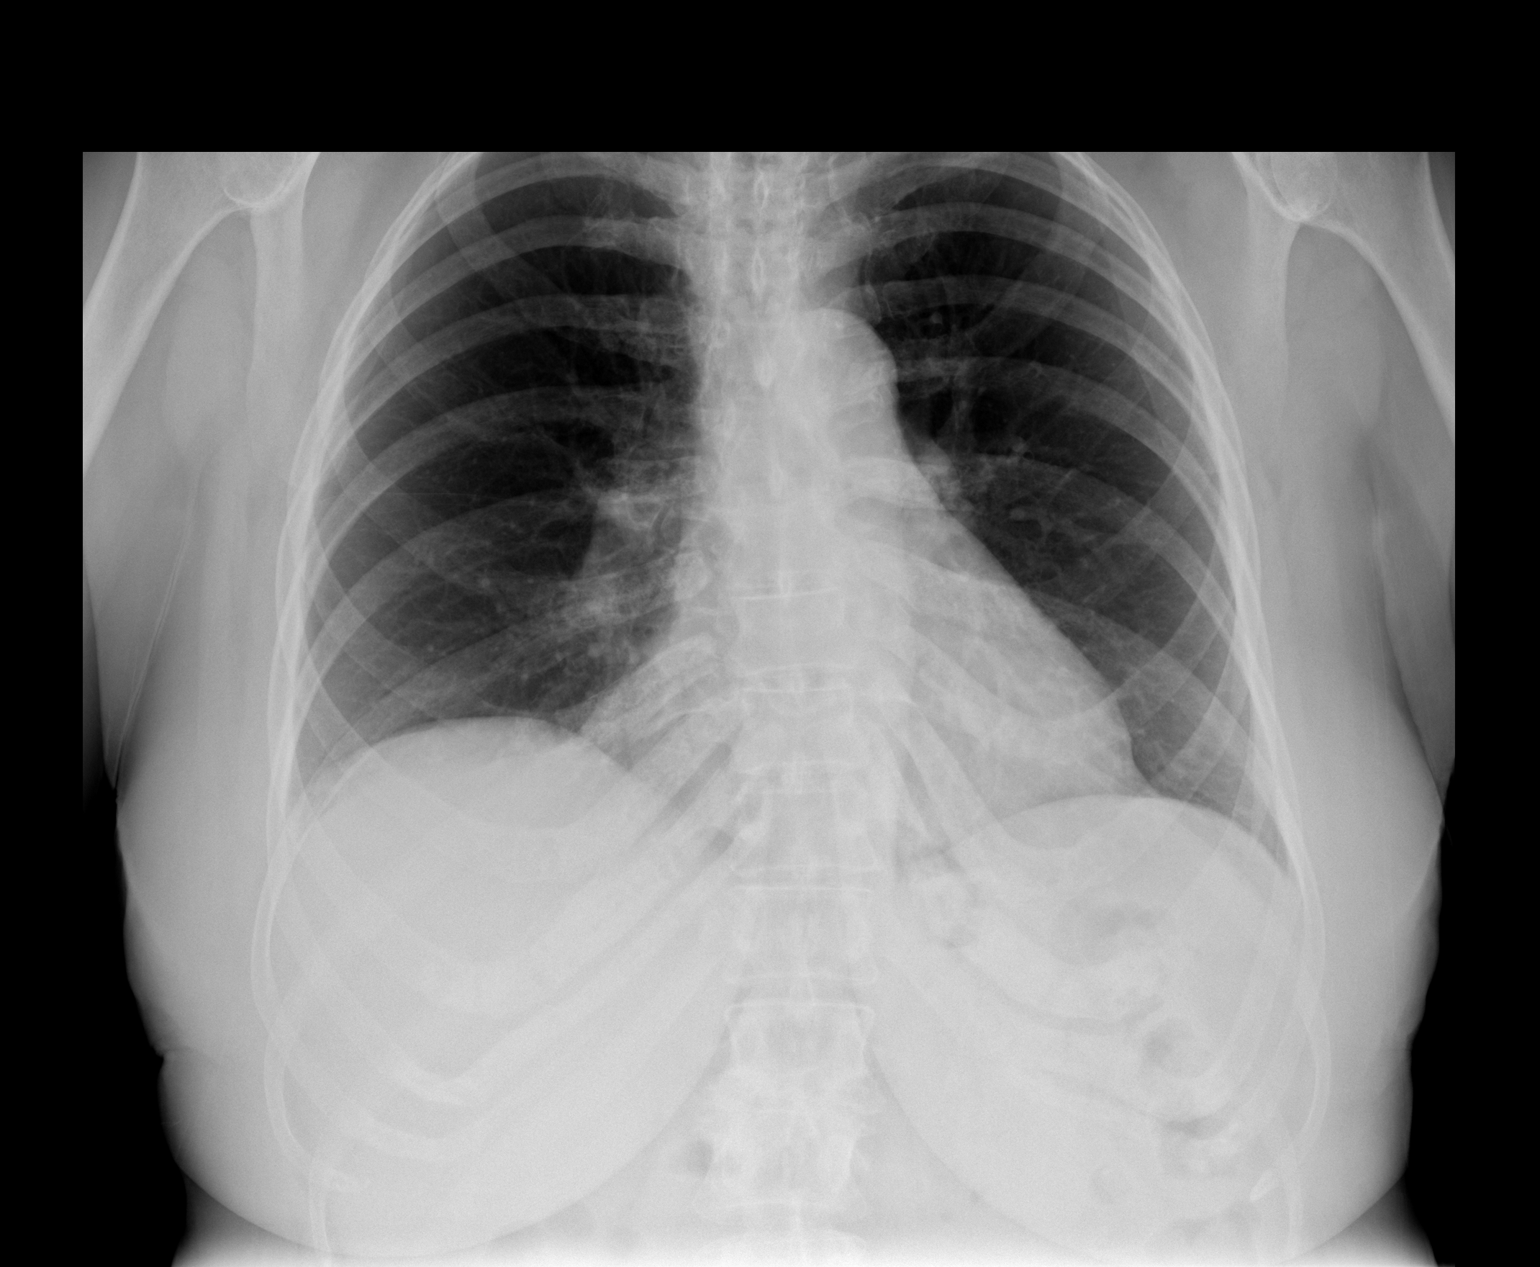

[view not recorded (2 of 2)]
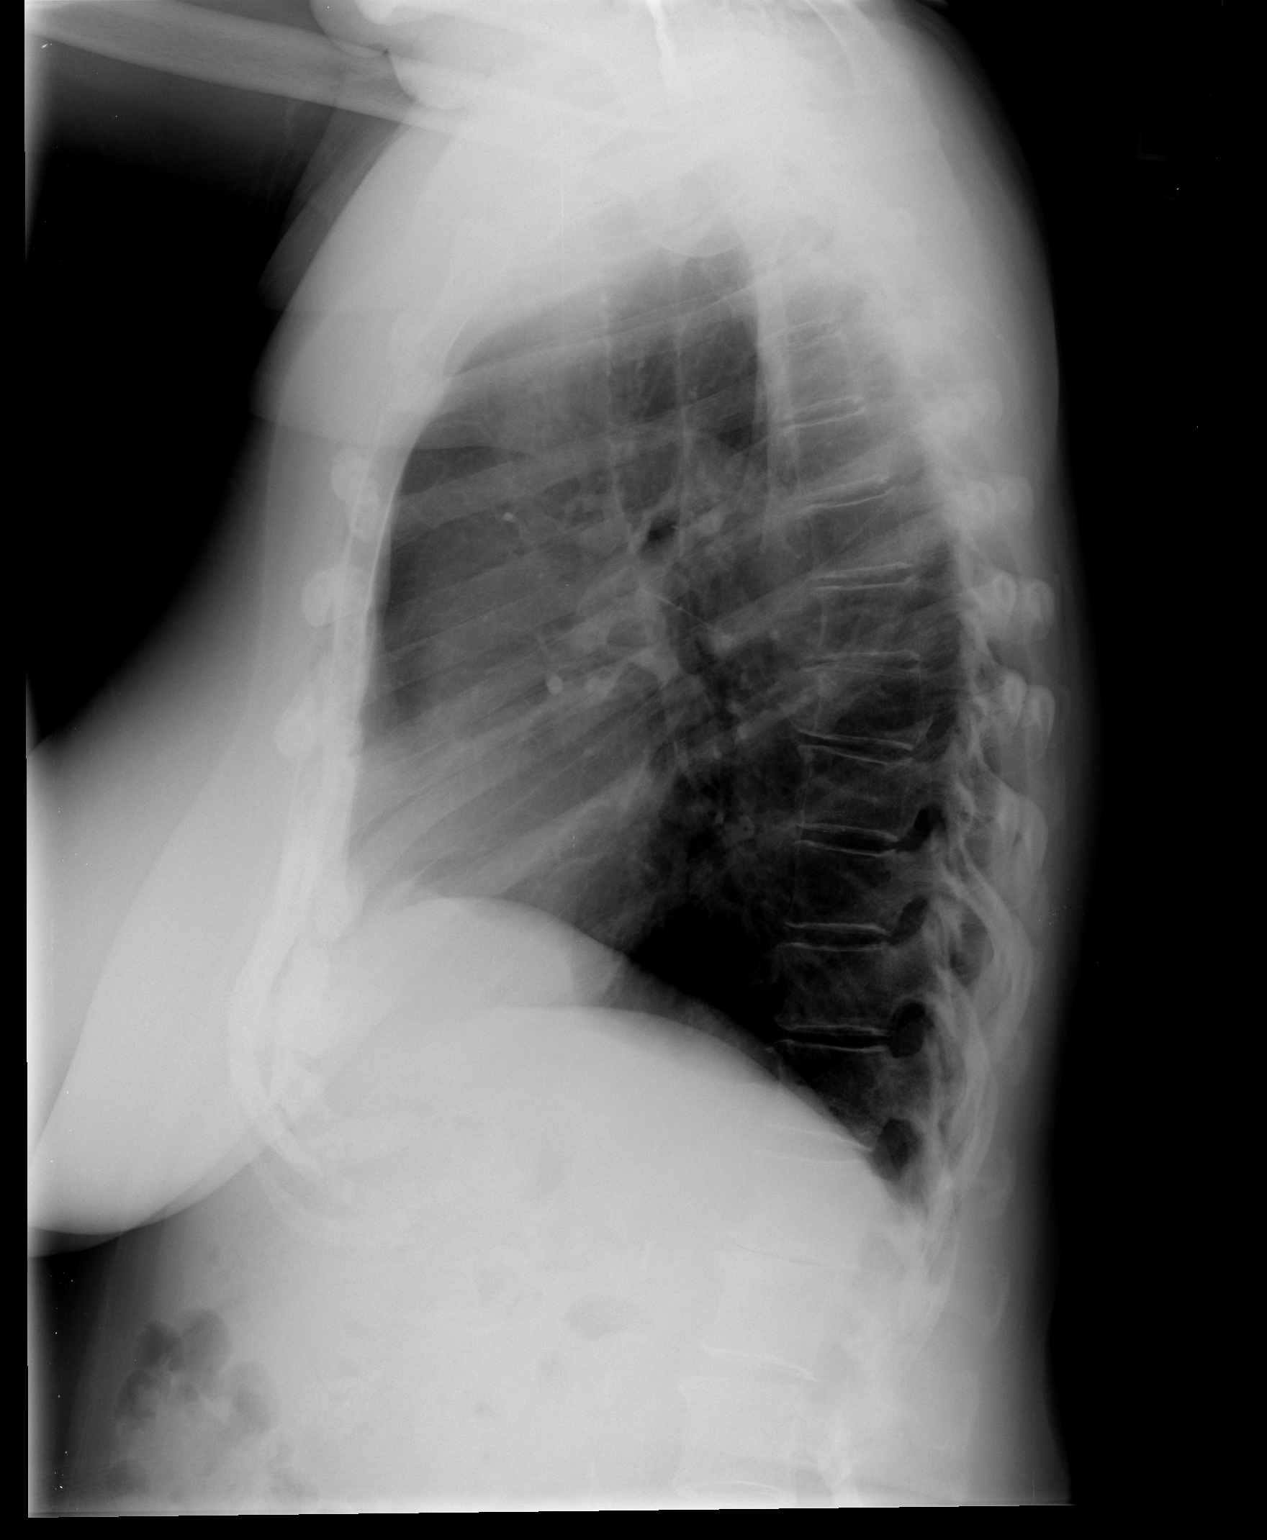

[2 of 2 positions shown; findings below may reference images not displayed]

FINDINGS: The heart, mediastinal and hilar contours are normal.
The lungs are clear.  There are no effusions or pneumothoraces.
There are no acute bony changes.
IMPRESSION: Normal chest.

## 2014-12-25 DIAGNOSIS — E119 Type 2 diabetes mellitus without complications: Secondary | ICD-10-CM | POA: Diagnosis not present

## 2014-12-25 DIAGNOSIS — N189 Chronic kidney disease, unspecified: Secondary | ICD-10-CM

## 2014-12-25 DIAGNOSIS — D631 Anemia in chronic kidney disease: Secondary | ICD-10-CM | POA: Diagnosis not present

## 2015-06-04 DIAGNOSIS — N189 Chronic kidney disease, unspecified: Secondary | ICD-10-CM

## 2015-06-04 DIAGNOSIS — D631 Anemia in chronic kidney disease: Secondary | ICD-10-CM

## 2015-10-16 DIAGNOSIS — N189 Chronic kidney disease, unspecified: Secondary | ICD-10-CM | POA: Diagnosis not present

## 2015-10-16 DIAGNOSIS — E119 Type 2 diabetes mellitus without complications: Secondary | ICD-10-CM | POA: Diagnosis not present

## 2015-10-16 DIAGNOSIS — D638 Anemia in other chronic diseases classified elsewhere: Secondary | ICD-10-CM | POA: Diagnosis not present

## 2015-12-17 ENCOUNTER — Other Ambulatory Visit: Payer: Self-pay | Admitting: Neurosurgery

## 2015-12-17 DIAGNOSIS — M5416 Radiculopathy, lumbar region: Secondary | ICD-10-CM

## 2015-12-29 ENCOUNTER — Other Ambulatory Visit: Payer: Self-pay | Admitting: Neurosurgery

## 2015-12-29 ENCOUNTER — Ambulatory Visit
Admission: RE | Admit: 2015-12-29 | Discharge: 2015-12-29 | Disposition: A | Discharge status: Home / Self Care | Payer: Medicare Other | Source: Ambulatory Visit | Attending: Neurosurgery | Admitting: Neurosurgery

## 2015-12-29 DIAGNOSIS — M5416 Radiculopathy, lumbar region: Secondary | ICD-10-CM

## 2016-01-01 DIAGNOSIS — N189 Chronic kidney disease, unspecified: Secondary | ICD-10-CM

## 2016-01-01 DIAGNOSIS — E1122 Type 2 diabetes mellitus with diabetic chronic kidney disease: Secondary | ICD-10-CM

## 2016-01-01 DIAGNOSIS — D631 Anemia in chronic kidney disease: Secondary | ICD-10-CM

## 2016-01-05 ENCOUNTER — Other Ambulatory Visit: Payer: Self-pay | Admitting: Neurosurgery

## 2016-01-21 ENCOUNTER — Encounter (HOSPITAL_COMMUNITY)
Admission: RE | Admit: 2016-01-21 | Discharge: 2016-01-21 | Disposition: A | Discharge status: Home / Self Care | Payer: Medicare Other | Source: Ambulatory Visit | Attending: Neurosurgery | Admitting: Neurosurgery

## 2016-01-21 ENCOUNTER — Encounter (HOSPITAL_COMMUNITY): Payer: Self-pay

## 2016-01-21 DIAGNOSIS — N189 Chronic kidney disease, unspecified: Secondary | ICD-10-CM | POA: Insufficient documentation

## 2016-01-21 DIAGNOSIS — I129 Hypertensive chronic kidney disease with stage 1 through stage 4 chronic kidney disease, or unspecified chronic kidney disease: Secondary | ICD-10-CM | POA: Diagnosis not present

## 2016-01-21 DIAGNOSIS — Z01812 Encounter for preprocedural laboratory examination: Secondary | ICD-10-CM | POA: Diagnosis not present

## 2016-01-21 DIAGNOSIS — E1122 Type 2 diabetes mellitus with diabetic chronic kidney disease: Secondary | ICD-10-CM | POA: Diagnosis not present

## 2016-01-21 HISTORY — DX: Headache: R51

## 2016-01-21 HISTORY — DX: Pneumonia, unspecified organism: J18.9

## 2016-01-21 HISTORY — DX: Malignant (primary) neoplasm, unspecified: C80.1

## 2016-01-21 HISTORY — DX: Headache, unspecified: R51.9

## 2016-01-21 LAB — CBC
HCT: 34.8 % — ABNORMAL LOW (ref 36.0–46.0)
HEMOGLOBIN: 11.7 g/dL — AB (ref 12.0–15.0)
MCH: 31 pg (ref 26.0–34.0)
MCHC: 33.6 g/dL (ref 30.0–36.0)
MCV: 92.3 fL (ref 78.0–100.0)
PLATELETS: 253 10*3/uL (ref 150–400)
RBC: 3.77 MIL/uL — AB (ref 3.87–5.11)
RDW: 13.2 % (ref 11.5–15.5)
WBC: 7.3 10*3/uL (ref 4.0–10.5)

## 2016-01-21 LAB — BASIC METABOLIC PANEL
ANION GAP: 10 (ref 5–15)
BUN: 30 mg/dL — AB (ref 6–20)
CHLORIDE: 100 mmol/L — AB (ref 101–111)
CO2: 27 mmol/L (ref 22–32)
Calcium: 10.5 mg/dL — ABNORMAL HIGH (ref 8.9–10.3)
Creatinine, Ser: 1.7 mg/dL — ABNORMAL HIGH (ref 0.44–1.00)
GFR calc Af Amer: 32 mL/min — ABNORMAL LOW (ref >60)
GFR, EST NON AFRICAN AMERICAN: 27 mL/min — AB (ref >60)
Glucose, Bld: 79 mg/dL (ref 65–99)
POTASSIUM: 3.6 mmol/L (ref 3.5–5.1)
SODIUM: 137 mmol/L (ref 135–145)

## 2016-01-21 LAB — TYPE AND SCREEN
ABO/RH(D): O POS
ANTIBODY SCREEN: NEGATIVE

## 2016-01-21 LAB — SURGICAL PCR SCREEN
MRSA, PCR: NEGATIVE
STAPHYLOCOCCUS AUREUS: POSITIVE — AB

## 2016-01-21 LAB — GLUCOSE, CAPILLARY: Glucose-Capillary: 130 mg/dL — ABNORMAL HIGH (ref 65–99)

## 2016-01-21 LAB — ABO/RH: ABO/RH(D): O POS

## 2016-01-21 NOTE — Pre-Procedure Instructions (Addendum)
Kimberly Baker  01/21/2016      [facility], [place] [address] Phone: [phone] Fax: [phone]    Your procedure is scheduled on January 29, 2016  Report to [facility] at [place].M.  Call this number if you have problems the morning of surgery:  [phone]   Remember:  Do not eat food or drink liquids after midnight.  Take these medicines the morning of surgery with A SIP OF WATER Tylenol, Norvasc, Tessalon, Coreg (Carvedilol). Zyrtec, Cymbalta, Levothyroxine, Singulair, Prilosec, Oxycodone, Topamax, Ventolin Inhaler   Do not wear jewelry, make-up or nail polish.  Do not wear lotions, powders, or perfumes, or deodorant.  Do not shave 48 hours prior to surgery.  Do not bring valuables to the hospital.  [facility] is not responsible for any belongings or valuables.    How to Manage Your Diabetes Before and After Surgery  Why is it important to control my blood sugar before and after surgery? . Improving blood sugar levels before and after surgery helps healing and can limit problems. . A way of improving blood sugar control is eating a healthy diet by: o  Eating less sugar and carbohydrates o  Increasing activity/exercise o  Talking with your doctor about reaching your blood sugar goals . High blood sugars (greater than 180 mg/dL) can raise your risk of infections and slow your recovery, so you will need to focus on controlling your diabetes during the weeks before surgery. . Make sure that the doctor who takes care of your diabetes knows about your planned surgery including the date and location.  How do I manage my blood sugar before surgery? . Check your blood sugar at least 4 times a day, starting 2 days before surgery, to make sure that the level is not too high or low. o Check your blood sugar the morning of your surgery when you wake up and every 2 hours until  you get to the Short Stay unit. . If your blood sugar is less than 70 mg/dL, you will need to treat for low blood sugar: o Do not take insulin. o Treat a low blood sugar (less than 70 mg/dL) with  cup of clear juice (cranberry or apple), 4 glucose tablets, OR glucose gel. o Recheck blood sugar in 15 minutes after treatment (to make sure it is greater than 70 mg/dL). If your blood sugar is not greater than 70 mg/dL on recheck, call [phone] for further instructions. . Report your blood sugar to the short stay nurse when you get to Short Stay.  . If you are admitted to the hospital after surgery: o Your blood sugar will be checked by the staff and you will probably be given insulin after surgery (instead of oral diabetes medicines) to make sure you have good blood sugar levels. o The goal for blood sugar control after surgery is 80-180 mg/dL.              WHAT DO I DO ABOUT MY DIABETES MEDICATION?   Marland Kitchen Do not take oral diabetes medicines (pills) the morning of surgery.  . THE NIGHT BEFORE SURGERY, take _____6______ units of _____Lantus______insulin.      . The day of surgery, do not take other diabetes injectables, including Byetta (exenatide), Bydureon (exenatide ER), Victoza (liraglutide), or Trulicity (dulaglutide).  . If your CBG is greater than 220 mg/dL, you may take  of  your sliding scale (correction) dose of insulin.  Other Instructions:          Patient Signature:  Date:   Nurse Signature:  Date:   Reviewed and Endorsed by [facility] Patient Education Committee, August 2015  Contacts, dentures or bridgework may not be worn into surgery.  Leave your suitcase in the car.  After surgery it may be brought to your room.  For patients admitted to the hospital, discharge time will be determined by your treatment team.  Patients discharged the day of surgery will not be allowed to drive home.   Name and phone number of your driver:    Special instructions:     [facility]- Preparing For Surgery  Before surgery, you can play an important role. Because skin is not sterile, your skin needs to be as free of germs as possible. You can reduce the number of germs on your skin by washing with CHG (chlorahexidine gluconate) Soap before surgery.  CHG is an antiseptic cleaner which kills germs and bonds with the skin to continue killing germs even after washing.  Please do not use if you have an allergy to CHG or antibacterial soaps. If your skin becomes reddened/irritated stop using the CHG.  Do not shave (including legs and underarms) for at least 48 hours prior to first CHG shower. It is OK to shave your face.  Please follow these instructions carefully.   1. Shower the NIGHT BEFORE SURGERY and the MORNING OF SURGERY with CHG.   2. If you chose to wash your hair, wash your hair first as usual with your normal shampoo.  3. After you shampoo, rinse your hair and body thoroughly to remove the shampoo.  4. Use CHG as you would any other liquid soap. You can apply CHG directly to the skin and wash gently with a scrungie or a clean washcloth.   5. Apply the CHG Soap to your body ONLY FROM THE NECK DOWN.  Do not use on open wounds or open sores. Avoid contact with your eyes, ears, mouth and genitals (private parts). Wash genitals (private parts) with your normal soap.  6. Wash thoroughly, paying special attention to the area where your surgery will be performed.  7. Thoroughly rinse your body with warm water from the neck down.  8. DO NOT shower/wash with your normal soap after using and rinsing off the CHG Soap.  9. Pat yourself dry with a CLEAN TOWEL.   10. Wear CLEAN PAJAMAS   11. Place CLEAN SHEETS on your bed the night of your first shower and DO NOT SLEEP WITH PETS.    Day of Surgery: Do not apply any deodorants/lotions. Please wear clean clothes to the hospital/surgery center.      Please read over the following fact sheets that you were  given. Pain Booklet, Coughing and Deep Breathing, MRSA Information and Surgical Site Infection Prevention

## 2016-01-21 NOTE — Progress Notes (Signed)
PCP is Dr. Bea Graff in [place]. Requested EKG tracing. Cardiologist was Dr. Pati Gallo in [place] but patient thinks he has retired. Stress test in 2012. Request sent for report. Has had an echo but unsure of date, stated it was in the last ten years. No cardiac cath. Fasting blood sugar is low 200s. Kidney specialist is Jamal Maes with [facility]. Requested last office visit.

## 2016-01-25 ENCOUNTER — Encounter (HOSPITAL_COMMUNITY): Payer: Self-pay

## 2016-01-25 NOTE — Progress Notes (Signed)
Anesthesia Chart Review:  Pt is an 82 year old female scheduled forL4-5, L5-S1 redo decompression with fusion/possible interbodies on 01/10/2016 with Erline Levine, M.D.  - PCP is Gilford Rile, MD who has cleared pt for surgery (notes in care everywhere) - Nephrologist is Jamal Maes, MD, last office visit 07/10/15.   Past medical history includes: HTN, DM, PCKD, anemia, hypothyroidism, postop N/V, GERD. Never smoker. BMI 28. S/p lumbar laminectomy 01/30/12.  Medications include: Amlodipine, carvedilol, fenofibrate, Lasix, Humalog, Lantus, levothyroxine, Prilosec, pravastatin, albuterol.   Preoperative labs reviewed.  - Glucose 79. HgbA1c was 8.1 on 01/14/16.  - CR 1.70, BUNs 30. Baseline CR appears to be 1.3-1.4 but values range 1.3-1.7 over last 2 years.  EKG 01/14/16: Sinus rhythm. Nonspecific T wave changes.  From review of notes, pt reports she had a stress test done many years ago before an ear surgery by Dr. Pati Gallo in [place] who is now retired. We are unable to get results. Pt reports no problems identified on stress test.   If no changes, I anticipate pt can proceed with surgery as scheduled.   Willeen Cass, FNP-BC [facility] Short Stay Surgical Center/Anesthesiology Phone: [phone] 01/25/2016 3:10 PM

## 2016-01-29 ENCOUNTER — Inpatient Hospital Stay (HOSPITAL_COMMUNITY)
Admission: RE | Admit: 2016-01-29 | Discharge: 2016-02-08 | DRG: 459 | Disposition: E | Payer: Medicare Other | Source: Ambulatory Visit | Attending: Neurosurgery | Admitting: Neurosurgery

## 2016-01-29 ENCOUNTER — Inpatient Hospital Stay (HOSPITAL_COMMUNITY): Payer: Medicare Other | Admitting: Certified Registered Nurse Anesthetist

## 2016-01-29 ENCOUNTER — Inpatient Hospital Stay (HOSPITAL_COMMUNITY): Payer: Medicare Other | Admitting: Emergency Medicine

## 2016-01-29 ENCOUNTER — Inpatient Hospital Stay (HOSPITAL_COMMUNITY): Payer: Medicare Other

## 2016-01-29 ENCOUNTER — Encounter (HOSPITAL_COMMUNITY): Payer: Self-pay | Admitting: Surgery

## 2016-01-29 ENCOUNTER — Encounter (HOSPITAL_COMMUNITY): Admission: RE | Disposition: E | Payer: Self-pay | Source: Ambulatory Visit | Attending: Neurosurgery

## 2016-01-29 DIAGNOSIS — J9601 Acute respiratory failure with hypoxia: Secondary | ICD-10-CM

## 2016-01-29 DIAGNOSIS — J690 Pneumonitis due to inhalation of food and vomit: Secondary | ICD-10-CM | POA: Diagnosis not present

## 2016-01-29 DIAGNOSIS — Z515 Encounter for palliative care: Secondary | ICD-10-CM | POA: Diagnosis not present

## 2016-01-29 DIAGNOSIS — E1140 Type 2 diabetes mellitus with diabetic neuropathy, unspecified: Secondary | ICD-10-CM | POA: Diagnosis present

## 2016-01-29 DIAGNOSIS — E870 Hyperosmolality and hypernatremia: Secondary | ICD-10-CM | POA: Diagnosis not present

## 2016-01-29 DIAGNOSIS — R569 Unspecified convulsions: Secondary | ICD-10-CM

## 2016-01-29 DIAGNOSIS — G4089 Other seizures: Secondary | ICD-10-CM | POA: Diagnosis present

## 2016-01-29 DIAGNOSIS — M48062 Spinal stenosis, lumbar region with neurogenic claudication: Principal | ICD-10-CM

## 2016-01-29 DIAGNOSIS — G9340 Encephalopathy, unspecified: Secondary | ICD-10-CM | POA: Diagnosis not present

## 2016-01-29 DIAGNOSIS — M545 Low back pain: Secondary | ICD-10-CM | POA: Diagnosis present

## 2016-01-29 DIAGNOSIS — E876 Hypokalemia: Secondary | ICD-10-CM | POA: Diagnosis not present

## 2016-01-29 DIAGNOSIS — M5116 Intervertebral disc disorders with radiculopathy, lumbar region: Secondary | ICD-10-CM | POA: Diagnosis present

## 2016-01-29 DIAGNOSIS — R6521 Severe sepsis with septic shock: Secondary | ICD-10-CM | POA: Diagnosis not present

## 2016-01-29 DIAGNOSIS — I129 Hypertensive chronic kidney disease with stage 1 through stage 4 chronic kidney disease, or unspecified chronic kidney disease: Secondary | ICD-10-CM | POA: Diagnosis present

## 2016-01-29 DIAGNOSIS — Z9049 Acquired absence of other specified parts of digestive tract: Secondary | ICD-10-CM | POA: Diagnosis not present

## 2016-01-29 DIAGNOSIS — R5383 Other fatigue: Secondary | ICD-10-CM

## 2016-01-29 DIAGNOSIS — I615 Nontraumatic intracerebral hemorrhage, intraventricular: Secondary | ICD-10-CM | POA: Diagnosis not present

## 2016-01-29 DIAGNOSIS — N189 Chronic kidney disease, unspecified: Secondary | ICD-10-CM | POA: Diagnosis present

## 2016-01-29 DIAGNOSIS — A419 Sepsis, unspecified organism: Secondary | ICD-10-CM | POA: Diagnosis not present

## 2016-01-29 DIAGNOSIS — E1122 Type 2 diabetes mellitus with diabetic chronic kidney disease: Secondary | ICD-10-CM | POA: Diagnosis present

## 2016-01-29 DIAGNOSIS — R4701 Aphasia: Secondary | ICD-10-CM | POA: Diagnosis not present

## 2016-01-29 DIAGNOSIS — I639 Cerebral infarction, unspecified: Secondary | ICD-10-CM

## 2016-01-29 DIAGNOSIS — J9690 Respiratory failure, unspecified, unspecified whether with hypoxia or hypercapnia: Secondary | ICD-10-CM

## 2016-01-29 DIAGNOSIS — J9600 Acute respiratory failure, unspecified whether with hypoxia or hypercapnia: Secondary | ICD-10-CM

## 2016-01-29 DIAGNOSIS — K219 Gastro-esophageal reflux disease without esophagitis: Secondary | ICD-10-CM | POA: Diagnosis present

## 2016-01-29 DIAGNOSIS — D649 Anemia, unspecified: Secondary | ICD-10-CM | POA: Diagnosis present

## 2016-01-29 DIAGNOSIS — I10 Essential (primary) hypertension: Secondary | ICD-10-CM | POA: Diagnosis not present

## 2016-01-29 DIAGNOSIS — I609 Nontraumatic subarachnoid hemorrhage, unspecified: Secondary | ICD-10-CM

## 2016-01-29 DIAGNOSIS — Z66 Do not resuscitate: Secondary | ICD-10-CM | POA: Diagnosis not present

## 2016-01-29 DIAGNOSIS — Z4659 Encounter for fitting and adjustment of other gastrointestinal appliance and device: Secondary | ICD-10-CM

## 2016-01-29 DIAGNOSIS — I82409 Acute embolism and thrombosis of unspecified deep veins of unspecified lower extremity: Secondary | ICD-10-CM

## 2016-01-29 DIAGNOSIS — E039 Hypothyroidism, unspecified: Secondary | ICD-10-CM | POA: Diagnosis present

## 2016-01-29 DIAGNOSIS — M7989 Other specified soft tissue disorders: Secondary | ICD-10-CM | POA: Diagnosis not present

## 2016-01-29 DIAGNOSIS — R4182 Altered mental status, unspecified: Secondary | ICD-10-CM

## 2016-01-29 DIAGNOSIS — Z7189 Other specified counseling: Secondary | ICD-10-CM

## 2016-01-29 LAB — GLUCOSE, CAPILLARY
GLUCOSE-CAPILLARY: 277 mg/dL — AB (ref 65–99)
GLUCOSE-CAPILLARY: 276 mg/dL — AB (ref 65–99)
Glucose-Capillary: 269 mg/dL — ABNORMAL HIGH (ref 65–99)
Glucose-Capillary: 279 mg/dL — ABNORMAL HIGH (ref 65–99)
Glucose-Capillary: 196 mg/dL — ABNORMAL HIGH (ref 65–99)
Glucose-Capillary: 188 mg/dL — ABNORMAL HIGH (ref 65–99)

## 2016-01-29 SURGERY — POSTERIOR LUMBAR FUSION 2 LEVEL
Anesthesia: General | Site: Spine Lumbar

## 2016-01-29 MED ORDER — PANTOPRAZOLE SODIUM 40 MG IV SOLR: 40.0000 mg | Freq: Every day | INTRAVENOUS | Status: DC

## 2016-01-29 MED ORDER — ONDANSETRON HCL 4 MG/2ML IJ SOLN: 4.0000 mg | Freq: Once | INTRAMUSCULAR | Status: DC | PRN

## 2016-01-29 MED ORDER — OXYCODONE-ACETAMINOPHEN 5-325 MG PO TABS
1.0000 | ORAL_TABLET | ORAL | Status: DC | PRN
  Administered 2016-01-29: 2 via ORAL

## 2016-01-29 MED ORDER — SUGAMMADEX SODIUM 200 MG/2ML IV SOLN
INTRAVENOUS | Status: AC
  Filled 2016-01-29: qty 2

## 2016-01-29 MED ORDER — CALCIUM CARBONATE 600 MG PO TABS
1200.0000 mg | ORAL_TABLET | Freq: Two times a day (BID) | ORAL | Status: DC
  Filled 2016-01-29: qty 2

## 2016-01-29 MED ORDER — LACTATED RINGERS IV SOLN
INTRAVENOUS | Status: DC
  Administered 2016-01-29: 08:00:00 via INTRAVENOUS

## 2016-01-29 MED ORDER — KCL IN DEXTROSE-NACL 20-5-0.45 MEQ/L-%-% IV SOLN
INTRAVENOUS | Status: DC
  Administered 2016-01-29: 16:00:00 via INTRAVENOUS
  Filled 2016-01-29 (×2): qty 1000

## 2016-01-29 MED ORDER — FUROSEMIDE 40 MG PO TABS: 40.0000 mg | ORAL_TABLET | Freq: Every day | ORAL | Status: DC

## 2016-01-29 MED ORDER — ALBUTEROL SULFATE (2.5 MG/3ML) 0.083% IN NEBU: 2.5000 mg | INHALATION_SOLUTION | RESPIRATORY_TRACT | Status: DC | PRN

## 2016-01-29 MED ORDER — METHOCARBAMOL 1000 MG/10ML IJ SOLN
500.0000 mg | Freq: Four times a day (QID) | INTRAVENOUS | Status: DC | PRN
  Filled 2016-01-29: qty 5

## 2016-01-29 MED ORDER — SODIUM CHLORIDE 0.9 % IV SOLN: 250.0000 mL | INTRAVENOUS | Status: DC

## 2016-01-29 MED ORDER — CEFAZOLIN SODIUM-DEXTROSE 2-4 GM/100ML-% IV SOLN
2.0000 g | INTRAVENOUS | Status: AC
  Administered 2016-01-29: 2 g via INTRAVENOUS

## 2016-01-29 MED ORDER — BUPIVACAINE LIPOSOME 1.3 % IJ SUSP
INTRAMUSCULAR | Status: DC | PRN
  Administered 2016-01-29: 20 mL

## 2016-01-29 MED ORDER — LACTATED RINGERS IV SOLN
INTRAVENOUS | Status: DC | PRN
  Administered 2016-01-29 (×2): via INTRAVENOUS

## 2016-01-29 MED ORDER — HEMOSTATIC AGENTS (NO CHARGE) OPTIME
TOPICAL | Status: DC | PRN
  Administered 2016-01-29: 1 via TOPICAL

## 2016-01-29 MED ORDER — METHOCARBAMOL 500 MG PO TABS
ORAL_TABLET | ORAL | Status: AC
  Filled 2016-01-29: qty 1

## 2016-01-29 MED ORDER — FENTANYL CITRATE (PF) 100 MCG/2ML IJ SOLN
INTRAMUSCULAR | Status: AC
  Administered 2016-01-29: 50 ug via INTRAVENOUS
  Filled 2016-01-29: qty 2

## 2016-01-29 MED ORDER — FENTANYL CITRATE (PF) 100 MCG/2ML IJ SOLN
25.0000 ug | INTRAMUSCULAR | Status: DC | PRN
  Administered 2016-01-29 (×3): 50 ug via INTRAVENOUS

## 2016-01-29 MED ORDER — LIDOCAINE-EPINEPHRINE (PF) 2 %-1:200000 IJ SOLN
INTRAMUSCULAR | Status: DC | PRN
  Administered 2016-01-29: 5 mL via INTRADERMAL

## 2016-01-29 MED ORDER — ZOLPIDEM TARTRATE 5 MG PO TABS: 5.0000 mg | ORAL_TABLET | Freq: Every day | ORAL | Status: DC

## 2016-01-29 MED ORDER — METHOCARBAMOL 500 MG PO TABS
ORAL_TABLET | ORAL | Status: AC
Start: 2016-01-29 — End: 2016-01-30
  Filled 2016-01-29: qty 1

## 2016-01-29 MED ORDER — ACETAMINOPHEN 325 MG PO TABS: 650.0000 mg | ORAL_TABLET | ORAL | Status: DC | PRN

## 2016-01-29 MED ORDER — BUPIVACAINE HCL (PF) 0.5 % IJ SOLN
INTRAMUSCULAR | Status: AC
Start: 2016-01-29 — End: 2016-01-29
  Filled 2016-01-29: qty 30

## 2016-01-29 MED ORDER — OXYCODONE HCL 5 MG PO TABS
ORAL_TABLET | ORAL | Status: AC
  Filled 2016-01-29: qty 2

## 2016-01-29 MED ORDER — HYDROXYZINE HCL 50 MG/ML IM SOLN
50.0000 mg | Freq: Four times a day (QID) | INTRAMUSCULAR | Status: DC | PRN
  Administered 2016-01-29 – 2016-01-30 (×2): 50 mg via INTRAMUSCULAR
  Filled 2016-01-29 (×3): qty 1

## 2016-01-29 MED ORDER — METHOCARBAMOL 500 MG PO TABS
500.0000 mg | ORAL_TABLET | Freq: Four times a day (QID) | ORAL | Status: DC | PRN
  Administered 2016-01-29: 500 mg via ORAL

## 2016-01-29 MED ORDER — ZOLPIDEM TARTRATE 5 MG PO TABS: 5.0000 mg | ORAL_TABLET | Freq: Every evening | ORAL | Status: DC | PRN

## 2016-01-29 MED ORDER — SUGAMMADEX SODIUM 200 MG/2ML IV SOLN
INTRAVENOUS | Status: DC | PRN
Start: 2016-01-29 — End: 2016-01-29
  Administered 2016-01-29: 120 mg via INTRAVENOUS

## 2016-01-29 MED ORDER — ALUM & MAG HYDROXIDE-SIMETH 200-200-20 MG/5ML PO SUSP: 30.0000 mL | Freq: Four times a day (QID) | ORAL | Status: DC | PRN

## 2016-01-29 MED ORDER — ACETAMINOPHEN 650 MG RE SUPP
650.0000 mg | RECTAL | Status: DC | PRN
  Administered 2016-02-01 – 2016-02-03 (×3): 650 mg via RECTAL
  Filled 2016-01-29 (×6): qty 1

## 2016-01-29 MED ORDER — INSULIN ASPART 100 UNIT/ML ~~LOC~~ SOLN
4.0000 [IU] | Freq: Three times a day (TID) | SUBCUTANEOUS | Status: DC
  Administered 2016-01-30: 4 [IU] via SUBCUTANEOUS

## 2016-01-29 MED ORDER — CHLORHEXIDINE GLUCONATE CLOTH 2 % EX PADS: 6.0000 | MEDICATED_PAD | Freq: Once | CUTANEOUS | Status: DC

## 2016-01-29 MED ORDER — OXYCODONE-ACETAMINOPHEN 5-325 MG PO TABS
ORAL_TABLET | ORAL | Status: AC
  Administered 2016-01-29: 2 via ORAL
  Filled 2016-01-29: qty 2

## 2016-01-29 MED ORDER — PHENYLEPHRINE 40 MCG/ML (10ML) SYRINGE FOR IV PUSH (FOR BLOOD PRESSURE SUPPORT)
PREFILLED_SYRINGE | INTRAVENOUS | Status: DC | PRN
  Administered 2016-01-29 (×4): 80 ug via INTRAVENOUS
  Administered 2016-01-29: 40 ug via INTRAVENOUS

## 2016-01-29 MED ORDER — LIDOCAINE 2% (20 MG/ML) 5 ML SYRINGE
INTRAMUSCULAR | Status: DC | PRN
  Administered 2016-01-29: 40 mg via INTRAVENOUS

## 2016-01-29 MED ORDER — THROMBIN 5000 UNITS EX SOLR
OROMUCOSAL | Status: DC | PRN
  Administered 2016-01-29: 12:00:00 via TOPICAL

## 2016-01-29 MED ORDER — SENNOSIDES-DOCUSATE SODIUM 8.6-50 MG PO TABS: 1.0000 | ORAL_TABLET | Freq: Every evening | ORAL | Status: DC | PRN

## 2016-01-29 MED ORDER — MENTHOL 3 MG MT LOZG: 1.0000 | LOZENGE | OROMUCOSAL | Status: DC | PRN

## 2016-01-29 MED ORDER — ONDANSETRON HCL 4 MG/2ML IJ SOLN
INTRAMUSCULAR | Status: DC | PRN
  Administered 2016-01-29: 4 mg via INTRAVENOUS

## 2016-01-29 MED ORDER — OXYCODONE HCL 5 MG PO TABS
5.0000 mg | ORAL_TABLET | ORAL | Status: DC | PRN
  Administered 2016-01-29: 10 mg via ORAL

## 2016-01-29 MED ORDER — INSULIN LISPRO 100 UNIT/ML (KWIKPEN): 4.0000 [IU] | PEN_INJECTOR | Freq: Three times a day (TID) | SUBCUTANEOUS | Status: DC

## 2016-01-29 MED ORDER — FENTANYL CITRATE (PF) 100 MCG/2ML IJ SOLN
INTRAMUSCULAR | Status: AC
  Filled 2016-01-29: qty 4

## 2016-01-29 MED ORDER — CEFAZOLIN IN D5W 1 GM/50ML IV SOLN
1.0000 g | Freq: Three times a day (TID) | INTRAVENOUS | Status: AC
  Administered 2016-01-29 – 2016-01-30 (×2): 1 g via INTRAVENOUS
  Filled 2016-01-29 (×2): qty 50

## 2016-01-29 MED ORDER — THROMBIN 20000 UNITS EX SOLR
CUTANEOUS | Status: DC | PRN
  Administered 2016-01-29: 12:00:00 via TOPICAL

## 2016-01-29 MED ORDER — FENTANYL CITRATE (PF) 100 MCG/2ML IJ SOLN
INTRAMUSCULAR | Status: AC
  Filled 2016-01-29: qty 2

## 2016-01-29 MED ORDER — SODIUM CHLORIDE 0.9% FLUSH: 3.0000 mL | INTRAVENOUS | Status: DC | PRN

## 2016-01-29 MED ORDER — ARTIFICIAL TEARS OP OINT
TOPICAL_OINTMENT | OPHTHALMIC | Status: AC
  Filled 2016-01-29: qty 3.5

## 2016-01-29 MED ORDER — INSULIN ASPART 100 UNIT/ML ~~LOC~~ SOLN
0.0000 [IU] | Freq: Every day | SUBCUTANEOUS | Status: DC
  Administered 2016-01-29 – 2016-01-31 (×2): 3 [IU] via SUBCUTANEOUS

## 2016-01-29 MED ORDER — PHENOL 1.4 % MT LIQD: 1.0000 | OROMUCOSAL | Status: DC | PRN

## 2016-01-29 MED ORDER — CEFAZOLIN SODIUM-DEXTROSE 2-4 GM/100ML-% IV SOLN
INTRAVENOUS | Status: AC
  Filled 2016-01-29: qty 100

## 2016-01-29 MED ORDER — HYDROMORPHONE HCL 1 MG/ML IJ SOLN
INTRAMUSCULAR | Status: AC
  Administered 2016-01-29: 0.5 mg via INTRAVENOUS
  Filled 2016-01-29: qty 0.5

## 2016-01-29 MED ORDER — INSULIN GLARGINE 100 UNIT/ML ~~LOC~~ SOLN
12.0000 [IU] | Freq: Every day | SUBCUTANEOUS | Status: DC
Start: 2016-01-29 — End: 2016-02-04
  Administered 2016-01-29 – 2016-02-03 (×6): 12 [IU] via SUBCUTANEOUS
  Filled 2016-01-29 (×7): qty 0.12

## 2016-01-29 MED ORDER — ONDANSETRON HCL 4 MG/2ML IJ SOLN
INTRAMUSCULAR | Status: AC
  Administered 2016-01-29: 4 mg via INTRAVENOUS
  Filled 2016-01-29: qty 2

## 2016-01-29 MED ORDER — ONDANSETRON HCL 4 MG/2ML IJ SOLN
INTRAMUSCULAR | Status: AC
  Filled 2016-01-29: qty 2

## 2016-01-29 MED ORDER — DOCUSATE SODIUM 100 MG PO CAPS: 100.0000 mg | ORAL_CAPSULE | Freq: Two times a day (BID) | ORAL | Status: DC

## 2016-01-29 MED ORDER — ROCURONIUM BROMIDE 10 MG/ML (PF) SYRINGE
PREFILLED_SYRINGE | INTRAVENOUS | Status: AC
  Filled 2016-01-29: qty 5

## 2016-01-29 MED ORDER — HYDROMORPHONE HCL 1 MG/ML IJ SOLN
INTRAMUSCULAR | Status: AC
  Administered 2016-01-29: 1 mg via INTRAVENOUS
  Filled 2016-01-29: qty 1

## 2016-01-29 MED ORDER — PHENYLEPHRINE 40 MCG/ML (10ML) SYRINGE FOR IV PUSH (FOR BLOOD PRESSURE SUPPORT)
PREFILLED_SYRINGE | INTRAVENOUS | Status: AC
  Filled 2016-01-29: qty 10

## 2016-01-29 MED ORDER — BUPIVACAINE HCL (PF) 0.5 % IJ SOLN
INTRAMUSCULAR | Status: DC | PRN
Start: 2016-01-29 — End: 2016-01-29
  Administered 2016-01-29: 5 mL via PERINEURAL

## 2016-01-29 MED ORDER — ARTIFICIAL TEARS OP OINT
TOPICAL_OINTMENT | OPHTHALMIC | Status: DC | PRN
  Administered 2016-01-29: 1 via OPHTHALMIC

## 2016-01-29 MED ORDER — HYDROMORPHONE HCL 1 MG/ML IJ SOLN
0.5000 mg | INTRAMUSCULAR | Status: DC | PRN
  Administered 2016-01-29: 0.5 mg via INTRAVENOUS
  Administered 2016-01-29 – 2016-01-31 (×4): 1 mg via INTRAVENOUS
  Filled 2016-01-29 (×3): qty 1

## 2016-01-29 MED ORDER — PROPOFOL 10 MG/ML IV BOLUS
INTRAVENOUS | Status: DC | PRN
  Administered 2016-01-29: 130 mg via INTRAVENOUS
  Administered 2016-01-29: 10 mg via INTRAVENOUS

## 2016-01-29 MED ORDER — FLEET ENEMA 7-19 GM/118ML RE ENEM
1.0000 | ENEMA | Freq: Once | RECTAL | Status: DC | PRN
Start: 2016-01-29 — End: 2016-02-04

## 2016-01-29 MED ORDER — INSULIN GLARGINE 100 UNIT/ML SOLOSTAR PEN: 12.0000 [IU] | PEN_INJECTOR | Freq: Every day | SUBCUTANEOUS | Status: DC

## 2016-01-29 MED ORDER — LIDOCAINE-EPINEPHRINE (PF) 2 %-1:200000 IJ SOLN
INTRAMUSCULAR | Status: AC
  Filled 2016-01-29: qty 20

## 2016-01-29 MED ORDER — BUPIVACAINE LIPOSOME 1.3 % IJ SUSP
20.0000 mL | Freq: Once | INTRAMUSCULAR | Status: DC
  Filled 2016-01-29: qty 20

## 2016-01-29 MED ORDER — DULOXETINE HCL 30 MG PO CPEP: 30.0000 mg | ORAL_CAPSULE | Freq: Every evening | ORAL | Status: DC

## 2016-01-29 MED ORDER — TOPIRAMATE 100 MG PO TABS: 100.0000 mg | ORAL_TABLET | Freq: Two times a day (BID) | ORAL | Status: DC

## 2016-01-29 MED ORDER — SODIUM CHLORIDE 0.9% FLUSH
3.0000 mL | Freq: Two times a day (BID) | INTRAVENOUS | Status: DC
  Administered 2016-01-30 – 2016-02-04 (×9): 3 mL via INTRAVENOUS

## 2016-01-29 MED ORDER — DEXTROSE 5 % IV SOLN
INTRAVENOUS | Status: DC | PRN
  Administered 2016-01-29: 15 ug/min via INTRAVENOUS

## 2016-01-29 MED ORDER — PROPOFOL 10 MG/ML IV BOLUS
INTRAVENOUS | Status: AC
  Filled 2016-01-29: qty 20

## 2016-01-29 MED ORDER — PRAVASTATIN SODIUM 40 MG PO TABS: 40.0000 mg | ORAL_TABLET | Freq: Every evening | ORAL | Status: DC

## 2016-01-29 MED ORDER — LIDOCAINE 2% (20 MG/ML) 5 ML SYRINGE
INTRAMUSCULAR | Status: AC
  Filled 2016-01-29: qty 5

## 2016-01-29 MED ORDER — ALBUTEROL SULFATE HFA 108 (90 BASE) MCG/ACT IN AERS: 2.0000 | INHALATION_SPRAY | RESPIRATORY_TRACT | Status: DC | PRN

## 2016-01-29 MED ORDER — LORATADINE 10 MG PO TABS: 10.0000 mg | ORAL_TABLET | Freq: Every day | ORAL | Status: DC

## 2016-01-29 MED ORDER — HYDROCODONE-ACETAMINOPHEN 5-325 MG PO TABS: 1.0000 | ORAL_TABLET | ORAL | Status: DC | PRN

## 2016-01-29 MED ORDER — ROCURONIUM BROMIDE 10 MG/ML (PF) SYRINGE
PREFILLED_SYRINGE | INTRAVENOUS | Status: DC | PRN
  Administered 2016-01-29: 40 mg via INTRAVENOUS
  Administered 2016-01-29: 10 mg via INTRAVENOUS

## 2016-01-29 MED ORDER — CARVEDILOL 12.5 MG PO TABS: 12.5000 mg | ORAL_TABLET | Freq: Two times a day (BID) | ORAL | Status: DC

## 2016-01-29 MED ORDER — THROMBIN 5000 UNITS EX SOLR
CUTANEOUS | Status: AC
Start: 2016-01-29 — End: 2016-01-29
  Filled 2016-01-29: qty 5000

## 2016-01-29 MED ORDER — BENZONATATE 100 MG PO CAPS
200.0000 mg | ORAL_CAPSULE | Freq: Three times a day (TID) | ORAL | Status: DC | PRN
  Filled 2016-01-29: qty 2

## 2016-01-29 MED ORDER — 0.9 % SODIUM CHLORIDE (POUR BTL) OPTIME
TOPICAL | Status: DC | PRN
  Administered 2016-01-29: 1000 mL

## 2016-01-29 MED ORDER — FENOFIBRATE 160 MG PO TABS: 160.0000 mg | ORAL_TABLET | Freq: Every day | ORAL | Status: DC

## 2016-01-29 MED ORDER — CALCIUM CARBONATE 1250 (500 CA) MG PO TABS: 1.0000 | ORAL_TABLET | Freq: Two times a day (BID) | ORAL | Status: DC

## 2016-01-29 MED ORDER — AMLODIPINE BESYLATE 5 MG PO TABS: 5.0000 mg | ORAL_TABLET | Freq: Every day | ORAL | Status: DC

## 2016-01-29 MED ORDER — ONDANSETRON HCL 4 MG/2ML IJ SOLN
4.0000 mg | INTRAMUSCULAR | Status: DC | PRN
Start: 2016-01-29 — End: 2016-02-04
  Administered 2016-01-29 – 2016-01-30 (×2): 4 mg via INTRAVENOUS
  Filled 2016-01-29: qty 2

## 2016-01-29 MED ORDER — INSULIN ASPART 100 UNIT/ML ~~LOC~~ SOLN
0.0000 [IU] | Freq: Three times a day (TID) | SUBCUTANEOUS | Status: DC
  Administered 2016-01-30 – 2016-01-31 (×3): 5 [IU] via SUBCUTANEOUS
  Administered 2016-01-31 – 2016-02-01 (×2): 3 [IU] via SUBCUTANEOUS
  Administered 2016-02-01: 8 [IU] via SUBCUTANEOUS

## 2016-01-29 MED ORDER — INSULIN ASPART 100 UNIT/ML ~~LOC~~ SOLN
4.0000 [IU] | Freq: Once | SUBCUTANEOUS | Status: AC
  Administered 2016-01-29: 4 [IU] via SUBCUTANEOUS

## 2016-01-29 MED ORDER — ACETAMINOPHEN ER 650 MG PO TBCR: 650.0000 mg | EXTENDED_RELEASE_TABLET | Freq: Three times a day (TID) | ORAL | Status: DC | PRN

## 2016-01-29 MED ORDER — FENTANYL CITRATE (PF) 100 MCG/2ML IJ SOLN
INTRAMUSCULAR | Status: DC | PRN
  Administered 2016-01-29: 50 ug via INTRAVENOUS
  Administered 2016-01-29 (×2): 25 ug via INTRAVENOUS
  Administered 2016-01-29 (×3): 50 ug via INTRAVENOUS
  Administered 2016-01-29 (×2): 25 ug via INTRAVENOUS

## 2016-01-29 MED ORDER — LEVOTHYROXINE SODIUM 50 MCG PO TABS: 50.0000 ug | ORAL_TABLET | Freq: Every day | ORAL | Status: DC

## 2016-01-29 MED ORDER — PANTOPRAZOLE SODIUM 40 MG PO TBEC: 40.0000 mg | DELAYED_RELEASE_TABLET | Freq: Every day | ORAL | Status: DC

## 2016-01-29 MED ORDER — THROMBIN 20000 UNITS EX SOLR
CUTANEOUS | Status: AC
  Filled 2016-01-29: qty 20000

## 2016-01-29 MED ORDER — MONTELUKAST SODIUM 10 MG PO TABS
10.0000 mg | ORAL_TABLET | Freq: Every day | ORAL | Status: DC
  Filled 2016-01-29 (×2): qty 1

## 2016-01-29 MED ORDER — BISACODYL 10 MG RE SUPP: 10.0000 mg | Freq: Every day | RECTAL | Status: DC | PRN

## 2016-01-29 MED FILL — Heparin Sodium (Porcine) Inj 1000 Unit/ML: INTRAMUSCULAR | Qty: 30 | Status: AC

## 2016-01-29 MED FILL — Sodium Chloride IV Soln 0.9%: INTRAVENOUS | Qty: 1000 | Status: AC

## 2016-01-29 SURGICAL SUPPLY — 81 items
BASKET BONE COLLECTION (BASKET) ×2 IMPLANT
BLADE CLIPPER SURG (BLADE) IMPLANT
BONE CANC CHIPS 20CC PCAN1/4 (Bone Implant) ×3 IMPLANT
BUR MATCHSTICK NEURO 3.0 LAGG (BURR) ×3 IMPLANT
BUR PRECISION FLUTE 5.0 (BURR) ×3 IMPLANT
CANISTER SUCT 3000ML PPV (MISCELLANEOUS) ×3 IMPLANT
CARTRIDGE OIL MAESTRO DRILL (MISCELLANEOUS) ×1 IMPLANT
CHIPS CANC BONE 20CC PCAN1/4 (Bone Implant) ×1 IMPLANT
CONT SPEC 4OZ CLIKSEAL STRL BL (MISCELLANEOUS) ×3 IMPLANT
COVER BACK TABLE 60X90IN (DRAPES) ×3 IMPLANT
DECANTER SPIKE VIAL GLASS SM (MISCELLANEOUS) IMPLANT
DERMABOND ADVANCED (GAUZE/BANDAGES/DRESSINGS) ×2
DERMABOND ADVANCED .7 DNX12 (GAUZE/BANDAGES/DRESSINGS) ×1 IMPLANT
DIFFUSER DRILL AIR PNEUMATIC (MISCELLANEOUS) ×3 IMPLANT
DRAPE C-ARM 42X72 X-RAY (DRAPES) ×3 IMPLANT
DRAPE C-ARMOR (DRAPES) ×3 IMPLANT
DRAPE LAPAROTOMY 100X72X124 (DRAPES) ×3 IMPLANT
DRAPE POUCH INSTRU U-SHP 10X18 (DRAPES) ×3 IMPLANT
DRAPE SURG 17X23 STRL (DRAPES) ×3 IMPLANT
DRSG OPSITE POSTOP 4X8 (GAUZE/BANDAGES/DRESSINGS) ×3 IMPLANT
DURAPREP 26ML APPLICATOR (WOUND CARE) ×3 IMPLANT
ELECT REM PT RETURN 9FT ADLT (ELECTROSURGICAL) ×3
ELECTRODE REM PT RTRN 9FT ADLT (ELECTROSURGICAL) ×1 IMPLANT
EVACUATOR 1/8 PVC DRAIN (DRAIN) IMPLANT
GAUZE SPONGE 4X4 12PLY STRL (GAUZE/BANDAGES/DRESSINGS) ×3 IMPLANT
GAUZE SPONGE 4X4 16PLY XRAY LF (GAUZE/BANDAGES/DRESSINGS) IMPLANT
GLOVE BIO SURGEON STRL SZ8 (GLOVE) ×9 IMPLANT
GLOVE BIOGEL PI IND STRL 7.0 (GLOVE) ×2 IMPLANT
GLOVE BIOGEL PI IND STRL 7.5 (GLOVE) ×4 IMPLANT
GLOVE BIOGEL PI IND STRL 8 (GLOVE) ×2 IMPLANT
GLOVE BIOGEL PI IND STRL 8.5 (GLOVE) ×2 IMPLANT
GLOVE BIOGEL PI INDICATOR 7.0 (GLOVE) ×4
GLOVE BIOGEL PI INDICATOR 7.5 (GLOVE) ×8
GLOVE BIOGEL PI INDICATOR 8 (GLOVE) ×4
GLOVE BIOGEL PI INDICATOR 8.5 (GLOVE) ×4
GLOVE ECLIPSE 8.0 STRL XLNG CF (GLOVE) ×6 IMPLANT
GLOVE EXAM NITRILE LRG STRL (GLOVE) IMPLANT
GLOVE EXAM NITRILE XL STR (GLOVE) IMPLANT
GLOVE EXAM NITRILE XS STR PU (GLOVE) IMPLANT
GLOVE INDICATOR 8.5 STRL (GLOVE) ×3 IMPLANT
GLOVE SURG SS PI 7.0 STRL IVOR (GLOVE) ×15 IMPLANT
GOWN STRL REUS W/ TWL LRG LVL3 (GOWN DISPOSABLE) ×2 IMPLANT
GOWN STRL REUS W/ TWL XL LVL3 (GOWN DISPOSABLE) ×5 IMPLANT
GOWN STRL REUS W/TWL 2XL LVL3 (GOWN DISPOSABLE) ×3 IMPLANT
GOWN STRL REUS W/TWL LRG LVL3 (GOWN DISPOSABLE) ×4
GOWN STRL REUS W/TWL XL LVL3 (GOWN DISPOSABLE) ×10
HEMOSTAT POWDER SURGIFOAM 1G (HEMOSTASIS) ×3 IMPLANT
KIT BASIN OR (CUSTOM PROCEDURE TRAY) ×3 IMPLANT
KIT INFUSE SMALL (Orthopedic Implant) ×3 IMPLANT
KIT POSITION SURG JACKSON T1 (MISCELLANEOUS) ×3 IMPLANT
KIT ROOM TURNOVER OR (KITS) ×3 IMPLANT
MILL MEDIUM DISP (BLADE) ×3 IMPLANT
NEEDLE HYPO 21X1.5 SAFETY (NEEDLE) ×3 IMPLANT
NEEDLE HYPO 25X1 1.5 SAFETY (NEEDLE) ×3 IMPLANT
NEEDLE SPNL 18GX3.5 QUINCKE PK (NEEDLE) IMPLANT
NS IRRIG 1000ML POUR BTL (IV SOLUTION) ×3 IMPLANT
OIL CARTRIDGE MAESTRO DRILL (MISCELLANEOUS) ×3
PACK LAMINECTOMY NEURO (CUSTOM PROCEDURE TRAY) ×3 IMPLANT
PAD ARMBOARD 7.5X6 YLW CONV (MISCELLANEOUS) ×9 IMPLANT
PATTIES SURGICAL .5 X.5 (GAUZE/BANDAGES/DRESSINGS) IMPLANT
PATTIES SURGICAL .5 X1 (DISPOSABLE) IMPLANT
PATTIES SURGICAL 1X1 (DISPOSABLE) IMPLANT
ROD RELINE-O LORD 5.5X50MM (Rod) ×6 IMPLANT
SCREW LOCK RELINE 5.5 TULIP (Screw) ×18 IMPLANT
SCREW RELINE-O POLY 6.5X45 (Screw) ×18 IMPLANT
SEALANT ADHERUS EXTEND TIP (MISCELLANEOUS) ×3 IMPLANT
SPONGE LAP 4X18 X RAY DECT (DISPOSABLE) IMPLANT
SPONGE SURGIFOAM ABS GEL 100 (HEMOSTASIS) ×3 IMPLANT
STAPLER SKIN PROX WIDE 3.9 (STAPLE) IMPLANT
SUT PROLENE 6 0 BV (SUTURE) ×9 IMPLANT
SUT VIC AB 1 CT1 18XBRD ANBCTR (SUTURE) ×2 IMPLANT
SUT VIC AB 1 CT1 8-18 (SUTURE) ×4
SUT VIC AB 2-0 CT1 18 (SUTURE) ×6 IMPLANT
SUT VIC AB 3-0 SH 8-18 (SUTURE) ×6 IMPLANT
SYR 20CC LL (SYRINGE) ×3 IMPLANT
SYR 3ML LL SCALE MARK (SYRINGE) ×12 IMPLANT
SYR 5ML LL (SYRINGE) IMPLANT
TOWEL OR 17X24 6PK STRL BLUE (TOWEL DISPOSABLE) ×6 IMPLANT
TOWEL OR 17X26 10 PK STRL BLUE (TOWEL DISPOSABLE) ×3 IMPLANT
TRAY FOLEY W/METER SILVER 16FR (SET/KITS/TRAYS/PACK) ×3 IMPLANT
WATER STERILE IRR 1000ML POUR (IV SOLUTION) ×3 IMPLANT

## 2016-01-29 NOTE — Progress Notes (Signed)
Awake, alert, conversant.  MAEW with good strength.  Doing well. 

## 2016-01-29 NOTE — Anesthesia Preprocedure Evaluation (Signed)
Anesthesia Evaluation  Patient identified by MRN, date of birth, ID band Patient awake    Reviewed: Allergy & Precautions, NPO status , Patient's Chart, lab work & pertinent test results  Airway Mallampati: II  TM Distance: >3 FB     Dental  (+) Teeth Intact, Dental Advisory Given   Pulmonary    breath sounds clear to auscultation       Cardiovascular hypertension,  Rhythm:Regular Rate:Normal     Neuro/Psych    GI/Hepatic   Endo/Other  diabetes  Renal/GU      Musculoskeletal   Abdominal   Peds  Hematology   Anesthesia Other Findings   Reproductive/Obstetrics                             Anesthesia Physical Anesthesia Plan  ASA: III  Anesthesia Plan: General   Post-op Pain Management:    Induction: Intravenous  Airway Management Planned: Oral ETT  Additional Equipment:   Intra-op Plan:   Post-operative Plan: Extubation in OR  Informed Consent: I have reviewed the patients History and Physical, chart, labs and discussed the procedure including the risks, benefits and alternatives for the proposed anesthesia with the patient or authorized representative who has indicated his/her understanding and acceptance.   Dental advisory given  Plan Discussed with: CRNA and Anesthesiologist  Anesthesia Plan Comments:         Anesthesia Quick Evaluation

## 2016-01-29 NOTE — Brief Op Note (Signed)
02/06/2016  1:59 PM  PATIENT:  Kimberly Baker  82 y.o. female  PRE-OPERATIVE DIAGNOSIS:  Recurrent LUMBAR STENOSIS WITH NEUROGENIC CLAUDICATION, herniated lumbar disc with radiculopathy, lumbago L 45 and L 5 S 1 levels  POST-OPERATIVE DIAGNOSIS: Recurrent LUMBAR STENOSIS WITH NEUROGENIC CLAUDICATION, herniated lumbar disc with radiculopathy, lumbago L 45 and L 5 S 1 levels  PROCEDURE:  Procedure(s) with comments: LUMBAR FOUR -  LUMBAR FIVE, LUMBAR FIVE - SACRAL ONE REDO DECOMPRESSION WITH FUSION (N/A) - LUMBAR FOUR -  LUMBAR FIVE, LUMBAR FIVE - SACRAL ONE REDO DECOMPRESSION WITH FUSION with pedicle screw fixation and posterolateral arthrodesis  SURGEON:  Surgeon(s) and Role:    * Erline Levine, MD - Primary    * Kary Kos, MD - Assisting  PHYSICIAN ASSISTANT:   ASSISTANTS: Poteat, RN   ANESTHESIA:   general  EBL:  Total I/O In: 1500 [I.V.:1500] Out: 255 [Urine:205; Blood:50]  BLOOD ADMINISTERED:none  DRAINS: none   LOCAL MEDICATIONS USED:  MARCAINE    and LIDOCAINE   SPECIMEN:  No Specimen  DISPOSITION OF SPECIMEN:  N/A  COUNTS:  YES  TOURNIQUET:  * No tourniquets in log *  DICTATION: DICTATION: Patient is 82 year old woman with prior decompression at L4/5 and L 5 S 1 levels with severe recurrent lumbar stenosis and a foraminal disc herniation L 45 right with severe nerve root compression and spinal stenosis. It was elected to take her to surgery for redo decompression and discectomy and fusion at the L 45 and  L 5 S 1 levels.   Procedure: Patient was placed in a prone position on the [place] table after smooth and uncomplicated induction of general endotracheal anesthesia. Her low back was prepped and draped in usual sterile fashion with betadine scrub and DuraPrep. Area of incision was infiltrated with local lidocaine. Incision was made to the lumbodorsal fascia was incised and exposure was performed of the L 4 - S 1  transverse processes and facet joints.Confirmation of  level was performed with intraoperative X ray. Redo laminectomy with disarticulation of facet joints at L 45 and L 5 S 1 levels.  On removing the lateral aspect of the facet joint at the L 45 level on the left, a linear durotomy was made with leakage of CSF.  This was repaired with 3, 6-0 Prolene sutures without evidence of continued CSF leakage.  Under microdissection, the right side of the thecal sac was cleared of scar and a large amount of herniated disc material was removed from below the L 4 nerve root. After thorough decompression of all neural elements including thecal sac and bilateral L4, L 5,  S 1 nerve roots, pedicle screws were placed at L 4, L 5,  S 1 levels (6.5 x 45 mm at each level).  Intraoperative fluoroscopy confirmed correct orientationin the AP and lateral plane.  Final x-rays demonstrated well-positioned pedicle screw fixation.  50 mm lordotic rods were placed bilaterally and locked down in situ.  Posterolateral region was decorticated and packed with small BMP, local autograft, allograft (20 ccon each side from L 4 - S 1 levels).Dural sealant was used to cover dural repair.  Long-acting Marcaine was injected into the musculature.    Fascia was closed with 1 Vicryl sutures skin edges were reapproximated 2 and 3-0 Vicryl sutures. The wound is dressed with Dermabond and occlusive dressing.  The patient was extubated in the operating room and taken to recovery in stable satisfactory condition. Counts were correct at the end of the case.  PLAN OF CARE: Admit to inpatient   PATIENT DISPOSITION:  PACU - hemodynamically stable.   Delay start of Pharmacological VTE agent (>24hrs) due to surgical blood loss or risk of bleeding: yes   

## 2016-01-29 NOTE — Transfer of Care (Signed)
Immediate Anesthesia Transfer of Care Note  Patient: Kimberly Baker  Procedure(s) Performed: Procedure(s) with comments: LUMBAR FOUR -  LUMBAR FIVE, LUMBAR FIVE - SACRAL ONE REDO DECOMPRESSION WITH FUSION (N/A) - LUMBAR FOUR -  LUMBAR FIVE, LUMBAR FIVE - SACRAL ONE REDO DECOMPRESSION WITH FUSION  Patient Location: PACU  Anesthesia Type:General  Level of Consciousness: awake, alert  and oriented  Airway & Oxygen Therapy: Patient Spontanous Breathing and Patient connected to nasal cannula oxygen  Post-op Assessment: Report given to RN, Post -op Vital signs reviewed and stable and Patient moving all extremities X 4  Post vital signs: Reviewed and stable  Last Vitals:  Vitals:   01/30/2016 0808 02/07/2016 0859  BP: (!) 211/62 (!) 178/53  Pulse: 70   Resp: 18   Temp: 36.8 C     Last Pain:  Vitals:   01/27/2016 0808  TempSrc: Oral         Complications: No apparent anesthesia complications

## 2016-01-29 NOTE — Anesthesia Postprocedure Evaluation (Signed)
Anesthesia Post Note  Patient: Kimberly Baker  Procedure(s) Performed: Procedure(s) (LRB): LUMBAR FOUR -  LUMBAR FIVE, LUMBAR FIVE - SACRAL ONE REDO DECOMPRESSION WITH FUSION (N/A)  Patient location during evaluation: PACU Anesthesia Type: General Level of consciousness: awake and alert Pain management: pain level controlled Vital Signs Assessment: post-procedure vital signs reviewed and stable Respiratory status: spontaneous breathing, nonlabored ventilation, respiratory function stable and patient connected to nasal cannula oxygen Cardiovascular status: blood pressure returned to baseline and stable Postop Assessment: no signs of nausea or vomiting Anesthetic complications: no       Last Vitals:  Vitals:   01/16/2016 1730 02/03/2016 1850  BP: (!) 124/50 (!) 158/61  Pulse: 80 94  Resp:  18  Temp:  36.5 C    Last Pain:  Vitals:   01/21/2016 1850  TempSrc: Oral  PainSc:                  Roni Friberg,W. EDMOND

## 2016-01-29 NOTE — Op Note (Signed)
01/18/2016  1:59 PM  PATIENT:  Kimberly Baker  82 y.o. female  PRE-OPERATIVE DIAGNOSIS:  Recurrent LUMBAR STENOSIS WITH NEUROGENIC CLAUDICATION, herniated lumbar disc with radiculopathy, lumbago L 45 and L 5 S 1 levels  POST-OPERATIVE DIAGNOSIS: Recurrent LUMBAR STENOSIS WITH NEUROGENIC CLAUDICATION, herniated lumbar disc with radiculopathy, lumbago L 45 and L 5 S 1 levels  PROCEDURE:  Procedure(s) with comments: LUMBAR FOUR -  LUMBAR FIVE, LUMBAR FIVE - SACRAL ONE REDO DECOMPRESSION WITH FUSION (N/A) - LUMBAR FOUR -  LUMBAR FIVE, LUMBAR FIVE - SACRAL ONE REDO DECOMPRESSION WITH FUSION with pedicle screw fixation and posterolateral arthrodesis  SURGEON:  Surgeon(s) and Role:    * Erline Levine, MD - Primary    * Kary Kos, MD - Assisting  PHYSICIAN ASSISTANT:   ASSISTANTS: Poteat, RN   ANESTHESIA:   general  EBL:  Total I/O In: 1500 [I.V.:1500] Out: 255 [Urine:205; Blood:50]  BLOOD ADMINISTERED:none  DRAINS: none   LOCAL MEDICATIONS USED:  MARCAINE    and LIDOCAINE   SPECIMEN:  No Specimen  DISPOSITION OF SPECIMEN:  N/A  COUNTS:  YES  TOURNIQUET:  * No tourniquets in log *  DICTATION: DICTATION: Patient is 82 year old woman with prior decompression at L4/5 and L 5 S 1 levels with severe recurrent lumbar stenosis and a foraminal disc herniation L 45 right with severe nerve root compression and spinal stenosis. It was elected to take her to surgery for redo decompression and discectomy and fusion at the L 45 and  L 5 S 1 levels.   Procedure: Patient was placed in a prone position on the [place] table after smooth and uncomplicated induction of general endotracheal anesthesia. Her low back was prepped and draped in usual sterile fashion with betadine scrub and DuraPrep. Area of incision was infiltrated with local lidocaine. Incision was made to the lumbodorsal fascia was incised and exposure was performed of the L 4 - S 1  transverse processes and facet joints.Confirmation of  level was performed with intraoperative X ray. Redo laminectomy with disarticulation of facet joints at L 45 and L 5 S 1 levels.  On removing the lateral aspect of the facet joint at the L 45 level on the left, a linear durotomy was made with leakage of CSF.  This was repaired with 3, 6-0 Prolene sutures without evidence of continued CSF leakage.  Under microdissection, the right side of the thecal sac was cleared of scar and a large amount of herniated disc material was removed from below the L 4 nerve root. After thorough decompression of all neural elements including thecal sac and bilateral L4, L 5,  S 1 nerve roots, pedicle screws were placed at L 4, L 5,  S 1 levels (6.5 x 45 mm at each level).  Intraoperative fluoroscopy confirmed correct orientationin the AP and lateral plane.  Final x-rays demonstrated well-positioned pedicle screw fixation.  50 mm lordotic rods were placed bilaterally and locked down in situ.  Posterolateral region was decorticated and packed with small BMP, local autograft, allograft (20 ccon each side from L 4 - S 1 levels).Dural sealant was used to cover dural repair.  Long-acting Marcaine was injected into the musculature.    Fascia was closed with 1 Vicryl sutures skin edges were reapproximated 2 and 3-0 Vicryl sutures. The wound is dressed with Dermabond and occlusive dressing.  The patient was extubated in the operating room and taken to recovery in stable satisfactory condition. Counts were correct at the end of the case.  PLAN OF CARE: Admit to inpatient   PATIENT DISPOSITION:  PACU - hemodynamically stable.   Delay start of Pharmacological VTE agent (>24hrs) due to surgical blood loss or risk of bleeding: yes   

## 2016-01-29 NOTE — H&P (Signed)
Patient ID:   [id number] Patient: Kimberly Baker  Date of Birth: [birth year] Visit Type: Office Visit   Date: 01/04/2016 01:15 PM Provider: Marchia Meiers. Vertell Limber MD   This 82 year old female presents for back pain.  History of Present Illness: 1.  back pain    The patient returns to review her MRI. She reports her right L5-S1 TFESI injection with Dr. Maryjean Ka on 11/13 offered no lasting relief. She continues to have sacral and right leg pain. She states that her previous bone density scan was "very good".   Physical Exam: Right EHL, dorsiflexion, and hip abductor weakness, positive SLR on the right   12/29/15 MRI: Limited study as patient terminated exam before axial T2 imaging could be obtained. Compared to 2013 there is progressed degenerative disc disease at L4-5 and L5-S1. Chronic advanced disc degeneration at L1-2. Best explanation for right radiculopathy is right L4-5 foraminal impingement due to disc protrusion and facet hypertrophy. Interval L4-5 and L5-S1 posterior decompression with significantly improved canal patency. EGFR was measured at 30.        MEDICATIONS(added, continued or stopped this visit): Started Medication Directions Instruction Stopped   AMBIEN  ORAL      Claritin take 1 tablet by oral route  every day     Coreg take 1 tablet by oral route 2 times every day with food     Cymbalta      Ester-C with Bioflavonoids 500 mg-200 mg tablet      fenofibrate  ORAL      fish oil      Humalog inject by subcutaneous route per prescriber's instructions. Insulin dosing requires individualization.     Lantus      lasix  ORAL     11/25/2015 oxycodone 5 mg tablet take 1 tablet by oral route  every 8 hours as needed     PRAVASTATIN SODIUM take 1 tablet by oral route  every day     Prilosec take 2 capsule by oral route  every day before a meal     Singulair take 1 tablet by oral route  every day in the evening     synthroid  ORAL     11/25/2015 Topamax 50 mg tablet take 1  tablet by oral route 2 times every day x 5 days then 2 PO BID if tolerated       ALLERGIES: Ingredient Reaction Medication Name Comment  PROCHLORPERAZINE MALEATE SEIZURES Compazine   LEVOFLOXACIN RASH Levaquin   PROCHLORPERAZINE EDISYLATE SEIZURES Compazine   PROCHLORPERAZINE SEIZURES Compazine       Vitals Date Temp F BP Pulse Ht In Wt Lb BMI BSA Pain Score  01/04/2016  175/72 79 59 134 27.06  5/10      IMPRESSION The patient got little relief from her right L5-S1 TFESI injection with Dr. Maryjean Ka. Her MRI reveals advanced disc degeneration at L1-2 and progressive degeneration at L4-5 and L5-S1. She also has right L4-5 foraminal impingement due to a disc herniation and facet arthritis that is likely the cause of her right sided radiculopathy. On confrontational testing, she has right EHL, dorsiflexion, and hip abductor weakness with a positive SLR on the right. Due to her imaging and significant weakness, I recommend an L4-5, L5-S1 PLIF for alleviation of her low back and left leg symptoms.    Assessment/Plan # Detail Type Description   1. Assessment Radiculopathy, lumbar region (M54.16).       2. Assessment Lumbar stenosis with neurogenic claudication (M48.062).   Plan  Orders Aspen Lo Sag Rigid Panel Quick.         Schedule L4-5, L5-S1 PLIF with redo decompression. Nurse education given. Fitted for LSO brace.   Orders: Diagnostic Procedures: Assessment Procedure  M54.16 Lumbar Spine- AP/Lat  Miscellaneous: Assessment   M48.062 Aspen Lo Sag Rigid Panel Quick             Provider:  Marchia Meiers. Vertell Limber MD  01/04/2016 02:55 PM Dictation edited by: Johnella Moloney    CC Providers: Erline Levine MD [address] [place], [place] [address]              Electronically signed by Marchia Meiers. Vertell Limber MD on 01/05/2016 05:16 PM

## 2016-01-29 NOTE — Progress Notes (Signed)
Pt admitted to unit from pacu; pt A&O x4; IV intact and transfusing; foley intact and draining; back incision remains clean, dry and intact with no stain or active bleeding noted; MAE x4; skin tear with Tegaderm dsg intact to right elbow; no pressure ulcers or other wounds noted. Per report pt to remain flat in bed with HOB flat till Sunday. Pt sleeping in bed with call light within reach; reported off to oncoming RN. Delia Heady RN

## 2016-01-29 NOTE — Anesthesia Procedure Notes (Signed)
Procedure Name: Intubation Date/Time: 01/16/2016 10:42 AM Performed by: Garrison Columbus T Pre-anesthesia Checklist: Patient identified, Emergency Drugs available, Suction available and Patient being monitored Patient Re-evaluated:Patient Re-evaluated prior to inductionOxygen Delivery Method: Circle System Utilized Preoxygenation: Pre-oxygenation with 100% oxygen Intubation Type: IV induction Ventilation: Mask ventilation without difficulty and Oral airway inserted - appropriate to patient size Laryngoscope Size: [name] and 2 Grade View: Grade I Tube type: Oral Tube size: 7.0 mm Number of attempts: 1 Airway Equipment and Method: Stylet and Oral airway Placement Confirmation: ETT inserted through vocal cords under direct vision,  positive ETCO2 and breath sounds checked- equal and bilateral Secured at: 22 cm Tube secured with: Tape Dental Injury: Teeth and Oropharynx as per pre-operative assessment

## 2016-01-30 ENCOUNTER — Inpatient Hospital Stay (HOSPITAL_COMMUNITY): Payer: Medicare Other

## 2016-01-30 DIAGNOSIS — R4701 Aphasia: Secondary | ICD-10-CM

## 2016-01-30 DIAGNOSIS — R569 Unspecified convulsions: Secondary | ICD-10-CM

## 2016-01-30 LAB — COMPREHENSIVE METABOLIC PANEL
ALBUMIN: 3.1 g/dL — AB (ref 3.5–5.0)
ALT: 37 U/L (ref 14–54)
AST: 77 U/L — AB (ref 15–41)
Alkaline Phosphatase: 42 U/L (ref 38–126)
Anion gap: 12 (ref 5–15)
BILIRUBIN TOTAL: 1 mg/dL (ref 0.3–1.2)
BUN: 13 mg/dL (ref 6–20)
CHLORIDE: 102 mmol/L (ref 101–111)
CO2: 23 mmol/L (ref 22–32)
Calcium: 8.8 mg/dL — ABNORMAL LOW (ref 8.9–10.3)
Creatinine, Ser: 1.2 mg/dL — ABNORMAL HIGH (ref 0.44–1.00)
GFR calc Af Amer: 48 mL/min — ABNORMAL LOW (ref >60)
GFR calc non Af Amer: 42 mL/min — ABNORMAL LOW (ref >60)
GLUCOSE: 225 mg/dL — AB (ref 65–99)
Potassium: 2.9 mmol/L — ABNORMAL LOW (ref 3.5–5.1)
SODIUM: 137 mmol/L (ref 135–145)
TOTAL PROTEIN: 5.9 g/dL — AB (ref 6.5–8.1)

## 2016-01-30 LAB — GLUCOSE, CAPILLARY
GLUCOSE-CAPILLARY: 202 mg/dL — AB (ref 65–99)
GLUCOSE-CAPILLARY: 268 mg/dL — AB (ref 65–99)
Glucose-Capillary: 235 mg/dL — ABNORMAL HIGH (ref 65–99)
Glucose-Capillary: 187 mg/dL — ABNORMAL HIGH (ref 65–99)
Glucose-Capillary: 204 mg/dL — ABNORMAL HIGH (ref 65–99)

## 2016-01-30 LAB — CBC
HEMATOCRIT: 29.6 % — AB (ref 36.0–46.0)
Hemoglobin: 9.9 g/dL — ABNORMAL LOW (ref 12.0–15.0)
MCH: 30.7 pg (ref 26.0–34.0)
MCHC: 33.4 g/dL (ref 30.0–36.0)
MCV: 91.6 fL (ref 78.0–100.0)
Platelets: 229 10*3/uL (ref 150–400)
RBC: 3.23 MIL/uL — ABNORMAL LOW (ref 3.87–5.11)
RDW: 13.2 % (ref 11.5–15.5)
WBC: 13 10*3/uL — AB (ref 4.0–10.5)

## 2016-01-30 LAB — HEMOGLOBIN A1C
Hgb A1c MFr Bld: 7.9 % — ABNORMAL HIGH (ref 4.8–5.6)
MEAN PLASMA GLUCOSE: 180 mg/dL

## 2016-01-30 MED ORDER — PANTOPRAZOLE SODIUM 40 MG IV SOLR: 40.0000 mg | Freq: Every day | INTRAVENOUS | Status: DC

## 2016-01-30 MED ORDER — LORAZEPAM 2 MG/ML IJ SOLN
2.0000 mg | INTRAMUSCULAR | Status: AC
  Administered 2016-01-30: 2 mg via INTRAVENOUS
  Filled 2016-01-30: qty 1

## 2016-01-30 MED ORDER — PANTOPRAZOLE SODIUM 40 MG IV SOLR
40.0000 mg | Freq: Two times a day (BID) | INTRAVENOUS | Status: DC
  Administered 2016-01-30 – 2016-02-04 (×10): 40 mg via INTRAVENOUS
  Filled 2016-01-30 (×10): qty 40

## 2016-01-30 MED ORDER — POTASSIUM CHLORIDE CRYS ER 20 MEQ PO TBCR: 40.0000 meq | EXTENDED_RELEASE_TABLET | Freq: Once | ORAL | Status: DC

## 2016-01-30 MED ORDER — KCL IN DEXTROSE-NACL 10-5-0.45 MEQ/L-%-% IV SOLN
INTRAVENOUS | Status: DC
  Administered 2016-01-30: 75 mL/h via INTRAVENOUS
  Administered 2016-02-01 (×2): via INTRAVENOUS
  Filled 2016-01-30 (×6): qty 1000

## 2016-01-30 MED ORDER — SODIUM CHLORIDE 0.9 % IV SOLN
1500.0000 mg | Freq: Once | INTRAVENOUS | Status: AC
  Administered 2016-01-30: 1500 mg via INTRAVENOUS
  Filled 2016-01-30: qty 15

## 2016-01-30 MED ORDER — IOPAMIDOL (ISOVUE-370) INJECTION 76%
INTRAVENOUS | Status: AC
  Filled 2016-01-30: qty 50

## 2016-01-30 MED ORDER — SODIUM CHLORIDE 0.9 % IV SOLN
1000.0000 mg | Freq: Two times a day (BID) | INTRAVENOUS | Status: DC
  Administered 2016-01-31: 1000 mg via INTRAVENOUS
  Filled 2016-01-30: qty 10

## 2016-01-30 NOTE — Progress Notes (Signed)
OT Cancellation Note  Patient Details Name: GERTHA HUTMACHER MRN: [id number] DOB: 04/16/1933   Cancelled Treatment:    Reason Eval/Treat Not Completed: Patient not medically ready (pending CT/ bedrest x48 hours) OT will hold evaluation until BEDREST removed and appropriate to be OOB with therapy evaluation.   Vonita Moss   OTR/L Pager: [phone] Office: [phone] .  01/30/2016, 11:07 AM

## 2016-01-30 NOTE — Clinical Social Work Note (Signed)
CSW acknowledges SNF consult. PT cannot work with patient until Monday due to orders for flat bedrest for 48 hours. Will await recommendations on Monday.  Dayton Scrape, [place]

## 2016-01-30 NOTE — Progress Notes (Signed)
Patient ID: Kimberly Baker, female   DOB: 08-11-1933, 82 y.o.   MRN: [id number] Pt awake / alert, but globally aphasic, MAE, smiles at me, nurse states that the pt said, "This blanket is hot, take it off of me" when they went for CT head, so neuronal dysfunction seems variable. CT head reviewed, and I suspect the findings are secondary to pressure changes from unintended durotomy in the absence of trauma. She looks ok clinically from a MS standpoint and I suspect the aphasia will improve, and I have tried to reassure the family.

## 2016-01-30 NOTE — Progress Notes (Signed)
PT Cancellation Note  Patient Details Name: Kimberly Baker MRN: [id number] DOB: 1934-01-20   Cancelled Treatment:    Reason Eval/Treat Not Completed: Medical issues which prohibited therapy. Pt with orders for flat bedrest for 48 hours. PT to follow up Monday.   Lorriane Shire 01/30/2016, 9:35 AM

## 2016-01-30 NOTE — Progress Notes (Signed)
Subjective: Patient reports patient with condition of nausea and headache  Objective: Vital signs in last 24 hours: Temp:  [97.5 F (36.4 C)-98.8 F (37.1 C)] 98.8 F (37.1 C) (12/23 0523) Pulse Rate:  [75-100] 100 (12/23 0523) Resp:  [11-20] 20 (12/23 0523) BP: (113-178)/(50-67) 174/61 (12/23 0523) SpO2:  [96 %-100 %] 96 % (12/23 0523)  Intake/Output from previous day: 12/22 0701 - 12/23 0700 In: 1500 [I.V.:1500] Out: 1955 [Urine:1905; Blood:50] Intake/Output this shift: No intake/output data recorded.  moves all extremities well incision clean dry and intact  Lab Results:  Recent Labs  01/30/16 0446  WBC 13.0*  HGB 9.9*  HCT 29.6*  PLT 229   BMET  Recent Labs  01/30/16 0446  NA 137  K 2.9*  CL 102  CO2 23  GLUCOSE 225*  BUN 13  CREATININE 1.20*  CALCIUM 8.8*    Studies/Results: Dg Lumbar Spine 2-3 Views  Result Date: 01/11/2016 CLINICAL DATA:  LUMBAR FOUR - LUMBAR FIVE, LUMBAR FIVE - SACRAL ONE REDO DECOMPRESSION WITH FUSION For LUMBAR STENOSIS WITH NEUROGENIC CLAUDICATION. RSTO performed by Hampton Abbot RT-R Fluoro Time is 29 secs. EXAM: LUMBAR SPINE - 2-3 VIEW; DG C-ARM 61-120 MIN COMPARISON:  MRI 12/29/2015 FINDINGS: Three intraoperative fluoroscopic spot images document bilateral pedicle screws L4-S1. IMPRESSION: Posterior instrumented spinal fusion L4-S1. Electronically Signed   By: Lucrezia Europe M.D.   On: 01/13/2016 13:57   Dg Lumbar Spine 1 View  Result Date: 01/08/2016 CLINICAL DATA:  Lumbar spine surgery. EXAM: LUMBAR SPINE - 1 VIEW COMPARISON:  MRI 12/29/2015. FINDINGS: Lumbar spine numbered as per prior MRI.Metallic markers noted posteriorly at the L4 spinous process, the L5 spinous process, and the posterior aspect of the S1. Diffuse degenerative change. No acute bony abnormality. Aortic atherosclerotic vascular disease. IMPRESSION: Postsurgical changed lumbosacral spine as above . Electronically Signed   By: Marcello Moores  Register   On: 01/21/2016  13:50   Dg C-arm 1-60 Min  Result Date: 01/26/2016 CLINICAL DATA:  LUMBAR FOUR - LUMBAR FIVE, LUMBAR FIVE - SACRAL ONE REDO DECOMPRESSION WITH FUSION For LUMBAR STENOSIS WITH NEUROGENIC CLAUDICATION. RSTO performed by Hampton Abbot RT-R Fluoro Time is 29 secs. EXAM: LUMBAR SPINE - 2-3 VIEW; DG C-ARM 61-120 MIN COMPARISON:  MRI 12/29/2015 FINDINGS: Three intraoperative fluoroscopic spot images document bilateral pedicle screws L4-S1. IMPRESSION: Posterior instrumented spinal fusion L4-S1. Electronically Signed   By: Lucrezia Europe M.D.   On: 01/25/2016 13:57    Assessment/Plan: Continue flat bedrest mobilize and 48 hours.we will add IV Protonix  LOS: 1 day     Janeil Schexnayder P 01/30/2016, 8:23 AM

## 2016-01-30 NOTE — Progress Notes (Signed)
Called to room by patient family member.  Family member states she is not talking and there is a change in status. Patient is following commands.  Vitals and blood sugar taken.  Vitals within range.  Blood sugar elevated and insulin coverage given.  Patient has no nausea, vomiting or diarrhea.  Physician notified and labs, chest xray, CT and EEG ordered.  New IV placed in CT.  Patient talking clearly after leaving family.

## 2016-01-30 NOTE — Procedures (Signed)
History: 82 year old with aphasia in the setting of subarachnoid hemorrhage  Sedation: None  Technique: This is a 21 channel routine scalp EEG performed at the bedside with bipolar and monopolar montages arranged in accordance to the international 10/20 system of electrode placement. One channel was dedicated to EKG recording.    Background: The background is high amplitude and relatively disorganized theta activity at the onset of recording. There is a posterior dominant rhythm of 9 Hz seen better on the right than left. In the left hemisphere, most prominent in the left temporal lobe there is some rhythmic sharply contoured theta activity that does appear to have some evolution. This pattern waxes and wanes throughout the recording until administration of 2 mg IV Ativan. Following this the background markedly improved nurse's station of this left temporal activity.  Photic stimulation: Physiologic driving is not performed  EEG Abnormalities: 1) focal status epilepticus in the left temporal lobe which resolved with Ativan administration  Clinical Interpretation: This EEG is most consistent with a focal seizure in the left temporal lobe which resolves with Ativan administration. The appearance was not definite for seizure, but the improvement with Ativan is strongly suggestive that this pattern did in fact represent seizure.  Roland Rack, MD [facility] [phone]  If 7pm- 7am, please page neurology on call as listed in [place].

## 2016-01-30 NOTE — Progress Notes (Signed)
Bedside EEG completed, results pending. 

## 2016-01-30 NOTE — Progress Notes (Signed)
Patient ID: Kimberly Baker, female   DOB: 10-26-1933, 82 y.o.   MRN: [id number] Called back in see the patient with regard to mental status as the patient was not responding appropriately.  Patient is wide-awake however appears to have a receptive aphasia she will not follow commands she will mimic but does not give full effort appears to have a slight right hemiparesis but difficult to determine because of the lack of compliance with exam. She is at least 4 out of 5 in the right side  Will obtain stat head CT have contacted neurology will also contact medicine.

## 2016-01-30 NOTE — Consult Note (Signed)
Neurology Consultation Reason for Consult: Aphasia Referring Physician: Henrine Screws  CC: Aphasia  History is obtained from:patient  HPI: Kimberly Baker is a 82 y.o. female who underwent back surgery yesterdya. She was having nausea yesterday and after atarax, she wen to sleep around 7:30pm. Today, on awakening, she was found to be densely aphasic. She was taken for CT with th though of doing a perfusion, btu her plain CT showed significant subarachnoid hemorrhage. I discussed this with Dr. Ronnald Ramp of neurosurgery.     LKW: 12/22 7pm tpa given?: no, ICH Hunt and HEss: 3   ROS:  Unable to obtain due to altered mental status.   Past Medical History:  Diagnosis Date  . Anemia    for anemia for chronic kidney insuffiency  . Arthritis   . Cancer (HCC)    basal cell, squamous  . Chronic kidney disease    insuffiency  . Diabetes mellitus without complication ([place])   . GERD (gastroesophageal reflux disease)   . Headache    sinus  . Hypertension   . Hypothyroidism   . Neuropathy ([place])   . Pneumonia   . PONV (postoperative nausea and vomiting)      FHx:    Social History:  reports that she has never smoked. She has never used smokeless tobacco. She reports that she does not drink alcohol or use drugs.   Exam: Current vital signs: BP (!) 155/55 (BP Location: Right Arm)   Pulse 96   Temp 99.8 F (37.7 C) (Oral)   Resp 16   Ht 4\' 10"  (1.473 m)   Wt 60.6 kg (133 lb 8 oz)   SpO2 97%   BMI 27.90 kg/m  Vital signs in last 24 hours: Temp:  [97.5 F (36.4 C)-99.8 F (37.7 C)] 99.8 F (37.7 C) (12/23 0837) Pulse Rate:  [75-100] 96 (12/23 0837) Resp:  [11-20] 16 (12/23 0837) BP: (113-174)/(50-67) 155/55 (12/23 0837) SpO2:  [96 %-100 %] 97 % (12/23 0837)   Physical Exam  Constitutional: Appears well-developed and well-nourished.  Psych: Affect appropriate to situation Eyes: No scleral injection HENT: No OP obstrucion Head: Normocephalic.  Cardiovascular: Normal rate and  regular rhythm.  Respiratory: Effort normal and breath sounds normal to anterior ascultation GI: Soft.  No distension. There is no tenderness.  Skin: WDI  Neuro: Mental Status: Dense receptive aphasia.  Cranial Nerves: II: Does not blink to threat from the right. . Pupils are equal, round, and reactive to light.   III,IV, VI: EOMI without ptosis or diploplia.  V: Facial sensation is symmetric to temperature VII: Facial movement is symmetric.  VIII: hearing is intact to voice X: Uvula elevates symmetrically XI: Shoulder shrug is symmetric. XII: tongue is midline without atrophy or fasciculations.  Motor: She appears to move both sides relatively symmetrically, but does not participate with formal testing.  Sensory: Responds to nox stim x 4.  Cerebellar: Does not perform.   I have reviewed labs in epic and the results pertinent to this consultation are: Cr 1.2  I have reviewed the images obtained: CT head - SAH around the convexities bialterally.   Impression: 82 yo F with new aphasia and hemianopia. She doe shave some SAH on the left, but the deficits seem out of proportion to the amount of SAH. Will get STAT EEG, if no explanation, may need to consider MRI.   Recommendations: 1) EEG 2) if no explanation ofr deficits, MRI brain 3) Will follow.    Roland Rack, MD [facility]  [phone]  If 7pm- 7am, please page neurology on call as listed in [place].

## 2016-01-31 ENCOUNTER — Inpatient Hospital Stay (HOSPITAL_COMMUNITY): Payer: Medicare Other

## 2016-01-31 LAB — BASIC METABOLIC PANEL
Anion gap: 8 (ref 5–15)
BUN: 16 mg/dL (ref 6–20)
CHLORIDE: 105 mmol/L (ref 101–111)
CO2: 26 mmol/L (ref 22–32)
CREATININE: 1.02 mg/dL — AB (ref 0.44–1.00)
Calcium: 8.6 mg/dL — ABNORMAL LOW (ref 8.9–10.3)
GFR calc Af Amer: 59 mL/min — ABNORMAL LOW (ref >60)
GFR calc non Af Amer: 51 mL/min — ABNORMAL LOW (ref >60)
Glucose, Bld: 197 mg/dL — ABNORMAL HIGH (ref 65–99)
Potassium: 3.2 mmol/L — ABNORMAL LOW (ref 3.5–5.1)
SODIUM: 139 mmol/L (ref 135–145)

## 2016-01-31 LAB — GLUCOSE, CAPILLARY
GLUCOSE-CAPILLARY: 175 mg/dL — AB (ref 65–99)
GLUCOSE-CAPILLARY: 200 mg/dL — AB (ref 65–99)
Glucose-Capillary: 212 mg/dL — ABNORMAL HIGH (ref 65–99)
Glucose-Capillary: 217 mg/dL — ABNORMAL HIGH (ref 65–99)
Glucose-Capillary: 244 mg/dL — ABNORMAL HIGH (ref 65–99)

## 2016-01-31 MED ORDER — PHENYTOIN SODIUM 50 MG/ML IJ SOLN
100.0000 mg | Freq: Three times a day (TID) | INTRAMUSCULAR | Status: DC
  Administered 2016-01-31 – 2016-02-01 (×4): 100 mg via INTRAVENOUS
  Filled 2016-01-31 (×4): qty 2

## 2016-01-31 MED ORDER — LORAZEPAM 2 MG/ML IJ SOLN
2.0000 mg | Freq: Once | INTRAMUSCULAR | Status: AC
  Administered 2016-01-31: 2 mg via INTRAVENOUS

## 2016-01-31 MED ORDER — LORAZEPAM 2 MG/ML IJ SOLN
2.0000 mg | Freq: Once | INTRAMUSCULAR | Status: DC | PRN
  Filled 2016-01-31: qty 1

## 2016-01-31 MED ORDER — SODIUM CHLORIDE 0.9 % IV SOLN
750.0000 mg | Freq: Two times a day (BID) | INTRAVENOUS | Status: DC
  Administered 2016-01-31 – 2016-02-04 (×8): 750 mg via INTRAVENOUS
  Filled 2016-01-31 (×9): qty 7.5

## 2016-01-31 MED ORDER — SODIUM CHLORIDE 0.9 % IV SOLN: 20.0000 mg/kg | Freq: Once | INTRAVENOUS | Status: DC

## 2016-01-31 MED ORDER — LABETALOL HCL 5 MG/ML IV SOLN
10.0000 mg | INTRAVENOUS | Status: DC | PRN
Start: 2016-01-31 — End: 2016-02-02
  Administered 2016-01-31 – 2016-02-02 (×6): 10 mg via INTRAVENOUS
  Filled 2016-01-31 (×4): qty 4

## 2016-01-31 MED ORDER — SODIUM CHLORIDE 0.9 % IV SOLN
20.0000 mg/kg | INTRAVENOUS | Status: AC
  Administered 2016-01-31: 1212 mg via INTRAVENOUS
  Filled 2016-01-31: qty 24.24

## 2016-01-31 NOTE — Progress Notes (Signed)
There is left sided slowing which does not appear to be ongoing seizure. Given the appearance of the last EEG, a benzo trial was done to see if the pattern changed in a way to make me think more likely to be ictal, and I feel this is low liklihood at this time. Will continue keppra and dilantin and obtain more EEG data  Roland Rack, MD [facility] [phone]  If 7pm- 7am, please page neurology on call as listed in [place].

## 2016-01-31 NOTE — Progress Notes (Signed)
Subjective: Patient reports not speaking spontaneously.  Objective: Vital signs in last 24 hours: Temp:  [98.4 F (36.9 C)-100 F (37.8 C)] 99.1 F (37.3 C) (12/24 0700) Pulse Rate:  [78-95] 79 (12/24 0700) Resp:  [16-26] 16 (12/24 0700) BP: (113-181)/(34-75) 169/50 (12/24 0700) SpO2:  [93 %-100 %] 97 % (12/24 0700)  Intake/Output from previous day: 12/23 0701 - 12/24 0700 In: 1160 [I.V.:935; IV Piggyback:225] Out: 900 [Urine:900] Intake/Output this shift: No intake/output data recorded.  Physical Exam: Aphasic, purposeful with both upper and lower extremities, not following commands.  Back dressing flat, dry.  Lab Results:  Recent Labs  01/30/16 0446  WBC 13.0*  HGB 9.9*  HCT 29.6*  PLT 229   BMET  Recent Labs  01/30/16 0446  NA 137  K 2.9*  CL 102  CO2 23  GLUCOSE 225*  BUN 13  CREATININE 1.20*  CALCIUM 8.8*    Studies/Results: Ct Angio Head W Or Wo Contrast  Result Date: 01/30/2016 CLINICAL DATA:  Aphasia and confusion. Lumbar spine surgery yesterday. EXAM: CT ANGIOGRAPHY HEAD AND NECK TECHNIQUE: Multidetector CT imaging of the head and neck was performed using the standard protocol during bolus administration of intravenous contrast. Multiplanar CT image reconstructions and MIPs were obtained to evaluate the vascular anatomy. Carotid stenosis measurements (when applicable) are obtained utilizing NASCET criteria, using the distal internal carotid diameter as the denominator. CONTRAST:  50 mL Isovue 370 COMPARISON:  Head CT 11/03/2006 FINDINGS: CT HEAD FINDINGS Brain: Small volume pneumocephalus. Small to moderate volume acute subarachnoid hemorrhage, greatest in the posterior right sylvian fissure region with smaller amounts involving frontoparietal sulci bilaterally and in the region of the posterior left sylvian fissure. Small volume intraventricular hemorrhage is present in both occipital horns. There is no evidence of acute cortical infarct, mass, midline  shift, or extra-axial fluid collection. Subcentimeter hypodensity in the right frontal periventricular white matter is unchanged and compatible with a chronic lacunar infarct. Vascular: No hyperdense vessel or unexpected calcification. Skull: No fracture or osseous lesion. Sinuses: Chronic bilateral maxillary sinusitis with complete opacification on the right and subtotal opacification on the left. Subtotal bilateral ethmoid sinus opacification. Mild bilateral frontal and right sphenoid sinus mucosal thickening. Completely opacified, small left sphenoid sinus. Prior canal wall up mastoidectomy on the left. Small right mastoid effusion. Orbits: Prior bilateral cataract extraction. Review of the MIP images confirms the above findings CTA NECK FINDINGS Aortic arch: Standard aortic arch branching. Moderate arch atherosclerosis. Mild stenosis of the proximal left subclavian artery due to calcified plaque. Right carotid system: Patent without evidence of significant stenosis or dissection. Mild intimal thickening at the carotid bifurcation. Mildly tortuous mid cervical ICA with kinked appearance. Left carotid system: Patent without evidence of significant common or cervical internal carotid artery stenosis. Mild ECA origin stenosis due to calcified plaque. Vertebral arteries: Patent and codominant without evidence of significant stenosis or dissection. Skeleton: Scattered gas in the spinal canal related to recent surgery. Solid C4-C6 ACDF. Advanced adjacent segment disc degeneration at C3-4. Severe left C1-2 arthritis. Other neck: No mass or lymph node enlargement. Upper chest: Minimal dependent atelectasis in both lungs. 7 x 6 mm subpleural opacity in the posterior right upper lobe is favored to reflect focal atelectasis given similar adjacent densities. Minimal scarring in both lung apices. Review of the MIP images confirms the above findings CTA HEAD FINDINGS Anterior circulation: The internal carotid arteries are  widely patent from skullbase to carotid termini. ACAs and MCAs are patent without evidence of major branch  occlusion or significant proximal stenosis. There is the appearance of a severe focal mid A2 stenosis, however this is likely secondary to mass effect from an adjacent locule of gas rather than intrinsic stenosis. There is a bulbous appearance of the right A1- A2 junction measuring 2-2.5 mm in diameter. The anterior communicating artery and multiple ACA branches arise in this region, and a discrete aneurysm is not clearly identified. Posterior circulation: The intracranial vertebral arteries are widely patent to the basilar. PICA, AICA, and SCA origins appear patent. The basilar artery is widely patent. There is a tiny left posterior communicating artery. PCAs are patent without evidence of significant stenosis. No aneurysm. Venous sinuses: Patent. Irregularity and moderate narrowing of the posterior aspect of the superior sagittal sinus on arterial phase imaging with more normal appearance on the delayed phase. Anatomic variants: Hypoplastic left A1. Delayed phase: No abnormal enhancement. Review of the MIP images confirms the above findings IMPRESSION: 1. Small to moderate volume subarachnoid hemorrhage. 2. Slightly bulbous appearance of the right anterior communicating region without aid discrete aneurysm identified. 3. No large vessel occlusion or significant stenosis. 4. Small volume pneumocephalus and spinal canal gas consistent with recent surgery. 5. Aortic atherosclerosis. 6. Chronic sinusitis. Critical Value/emergent results were called by telephone at the time of interpretation on 01/30/2016 at 10:20 a.m. to Dr. Roland Rack , who verbally acknowledged these results. Electronically Signed   By: Logan Bores M.D.   On: 01/30/2016 11:29   Dg Lumbar Spine 2-3 Views  Result Date: 01/30/2016 CLINICAL DATA:  LUMBAR FOUR - LUMBAR FIVE, LUMBAR FIVE - SACRAL ONE REDO DECOMPRESSION WITH FUSION For  LUMBAR STENOSIS WITH NEUROGENIC CLAUDICATION. RSTO performed by Hampton Abbot RT-R Fluoro Time is 29 secs. EXAM: LUMBAR SPINE - 2-3 VIEW; DG C-ARM 61-120 MIN COMPARISON:  MRI 12/29/2015 FINDINGS: Three intraoperative fluoroscopic spot images document bilateral pedicle screws L4-S1. IMPRESSION: Posterior instrumented spinal fusion L4-S1. Electronically Signed   By: Lucrezia Europe M.D.   On: 01/16/2016 13:57   Ct Angio Neck W Or Wo Contrast  Result Date: 01/30/2016 CLINICAL DATA:  Aphasia and confusion. Lumbar spine surgery yesterday. EXAM: CT ANGIOGRAPHY HEAD AND NECK TECHNIQUE: Multidetector CT imaging of the head and neck was performed using the standard protocol during bolus administration of intravenous contrast. Multiplanar CT image reconstructions and MIPs were obtained to evaluate the vascular anatomy. Carotid stenosis measurements (when applicable) are obtained utilizing NASCET criteria, using the distal internal carotid diameter as the denominator. CONTRAST:  50 mL Isovue 370 COMPARISON:  Head CT 11/03/2006 FINDINGS: CT HEAD FINDINGS Brain: Small volume pneumocephalus. Small to moderate volume acute subarachnoid hemorrhage, greatest in the posterior right sylvian fissure region with smaller amounts involving frontoparietal sulci bilaterally and in the region of the posterior left sylvian fissure. Small volume intraventricular hemorrhage is present in both occipital horns. There is no evidence of acute cortical infarct, mass, midline shift, or extra-axial fluid collection. Subcentimeter hypodensity in the right frontal periventricular white matter is unchanged and compatible with a chronic lacunar infarct. Vascular: No hyperdense vessel or unexpected calcification. Skull: No fracture or osseous lesion. Sinuses: Chronic bilateral maxillary sinusitis with complete opacification on the right and subtotal opacification on the left. Subtotal bilateral ethmoid sinus opacification. Mild bilateral frontal and  right sphenoid sinus mucosal thickening. Completely opacified, small left sphenoid sinus. Prior canal wall up mastoidectomy on the left. Small right mastoid effusion. Orbits: Prior bilateral cataract extraction. Review of the MIP images confirms the above findings CTA NECK FINDINGS Aortic arch: Standard  aortic arch branching. Moderate arch atherosclerosis. Mild stenosis of the proximal left subclavian artery due to calcified plaque. Right carotid system: Patent without evidence of significant stenosis or dissection. Mild intimal thickening at the carotid bifurcation. Mildly tortuous mid cervical ICA with kinked appearance. Left carotid system: Patent without evidence of significant common or cervical internal carotid artery stenosis. Mild ECA origin stenosis due to calcified plaque. Vertebral arteries: Patent and codominant without evidence of significant stenosis or dissection. Skeleton: Scattered gas in the spinal canal related to recent surgery. Solid C4-C6 ACDF. Advanced adjacent segment disc degeneration at C3-4. Severe left C1-2 arthritis. Other neck: No mass or lymph node enlargement. Upper chest: Minimal dependent atelectasis in both lungs. 7 x 6 mm subpleural opacity in the posterior right upper lobe is favored to reflect focal atelectasis given similar adjacent densities. Minimal scarring in both lung apices. Review of the MIP images confirms the above findings CTA HEAD FINDINGS Anterior circulation: The internal carotid arteries are widely patent from skullbase to carotid termini. ACAs and MCAs are patent without evidence of major branch occlusion or significant proximal stenosis. There is the appearance of a severe focal mid A2 stenosis, however this is likely secondary to mass effect from an adjacent locule of gas rather than intrinsic stenosis. There is a bulbous appearance of the right A1- A2 junction measuring 2-2.5 mm in diameter. The anterior communicating artery and multiple ACA branches arise in  this region, and a discrete aneurysm is not clearly identified. Posterior circulation: The intracranial vertebral arteries are widely patent to the basilar. PICA, AICA, and SCA origins appear patent. The basilar artery is widely patent. There is a tiny left posterior communicating artery. PCAs are patent without evidence of significant stenosis. No aneurysm. Venous sinuses: Patent. Irregularity and moderate narrowing of the posterior aspect of the superior sagittal sinus on arterial phase imaging with more normal appearance on the delayed phase. Anatomic variants: Hypoplastic left A1. Delayed phase: No abnormal enhancement. Review of the MIP images confirms the above findings IMPRESSION: 1. Small to moderate volume subarachnoid hemorrhage. 2. Slightly bulbous appearance of the right anterior communicating region without aid discrete aneurysm identified. 3. No large vessel occlusion or significant stenosis. 4. Small volume pneumocephalus and spinal canal gas consistent with recent surgery. 5. Aortic atherosclerosis. 6. Chronic sinusitis. Critical Value/emergent results were called by telephone at the time of interpretation on 01/30/2016 at 10:20 a.m. to Dr. Roland Rack , who verbally acknowledged these results. Electronically Signed   By: Logan Bores M.D.   On: 01/30/2016 11:29   Dg Lumbar Spine 1 View  Result Date: 01/14/2016 CLINICAL DATA:  Lumbar spine surgery. EXAM: LUMBAR SPINE - 1 VIEW COMPARISON:  MRI 12/29/2015. FINDINGS: Lumbar spine numbered as per prior MRI.Metallic markers noted posteriorly at the L4 spinous process, the L5 spinous process, and the posterior aspect of the S1. Diffuse degenerative change. No acute bony abnormality. Aortic atherosclerotic vascular disease. IMPRESSION: Postsurgical changed lumbosacral spine as above . Electronically Signed   By: Marcello Moores  Register   On: 01/25/2016 13:50   Dg Chest Port 1 View  Result Date: 01/30/2016 CLINICAL DATA:  Lethargy, history  hypertension, diabetes mellitus, chronic kidney disease, GERD EXAM: PORTABLE CHEST 1 VIEW COMPARISON:  Portable exam 1039 hours compared to 01/24/2012 FINDINGS: Rotated to the LEFT. Upper normal heart size. Mediastinal contours and pulmonary vascularity normal. Atherosclerotic calcification aorta. Lungs grossly clear. No pleural effusion or pneumothorax. Extensive costal cartilaginous calcification. Bones demineralized with evidence of prior cervical spine fusion. IMPRESSION: No  acute abnormalities. Aortic atherosclerosis. Electronically Signed   By: Lavonia Dana M.D.   On: 01/30/2016 12:17   Dg C-arm 1-60 Min  Result Date: 01/12/2016 CLINICAL DATA:  LUMBAR FOUR - LUMBAR FIVE, LUMBAR FIVE - SACRAL ONE REDO DECOMPRESSION WITH FUSION For LUMBAR STENOSIS WITH NEUROGENIC CLAUDICATION. RSTO performed by Hampton Abbot RT-R Fluoro Time is 29 secs. EXAM: LUMBAR SPINE - 2-3 VIEW; DG C-ARM 61-120 MIN COMPARISON:  MRI 12/29/2015 FINDINGS: Three intraoperative fluoroscopic spot images document bilateral pedicle screws L4-S1. IMPRESSION: Posterior instrumented spinal fusion L4-S1. Electronically Signed   By: Lucrezia Europe M.D.   On: 01/14/2016 13:57    Assessment/Plan: Patient likely with continued seizures.  Dr. Leonel Ramsay has ordered STAT brain MRI to r/o stroke.  Will obtain continued EEG monitoring.  Patient being loaded with phenytoin for presumed refractory partial complex seizures.  D/W family, who understand situation and current plan.    LOS: 2 days    Peggyann Shoals, MD 01/31/2016, 9:11 AM

## 2016-01-31 NOTE — Progress Notes (Signed)
Subjective: No improvement, maybe slightly more somnolent today.   Exam: Vitals:   01/31/16 0401 01/31/16 0700  BP: (!) 137/47 (!) 169/50  Pulse: 82 79  Resp: 20 16  Temp: 98.9 F (37.2 C) 99.1 F (37.3 C)   Gen: In bed, NAD Resp: non-labored breathing, no acute distress Abd: soft, nt  Neuro: MS: opens eyes spontaneously, does not follow commands [id number], does not blink to threat from either direction.  Motor: moves all extremties voluntarily, uses the right arm to localize stimuli on the left, but does not respond to noxious stimuli on the right. Withdraws in all other extremities.  Sensory:as above  Pertinent Labs: Cr 1.2  Impression: 82 yo F with altered mental status in the setting of subarachnoid hemorrhage following CSF leak. My suspicion is that she may be having recurrent seizures, but without any evidence of positive symptoms, would want to rule out ischemic stroke as well.   Recommendations: 1) MRI brain, stat 2) Continuous EEG following MRI 3) fosphenytoin load 20 PE/Kg x 1 4) continue keppra, will reduce dose to 750mg  BID(renally dosed)   Roland Rack, MD [facility] [phone]  If 7pm- 7am, please page neurology on call as listed in [place].

## 2016-01-31 NOTE — Progress Notes (Signed)
LTM EEG intitiated per order of Dr. Leonel Ramsay.

## 2016-02-01 DIAGNOSIS — R569 Unspecified convulsions: Secondary | ICD-10-CM

## 2016-02-01 DIAGNOSIS — G9340 Encephalopathy, unspecified: Secondary | ICD-10-CM

## 2016-02-01 DIAGNOSIS — I10 Essential (primary) hypertension: Secondary | ICD-10-CM

## 2016-02-01 LAB — CBC WITH DIFFERENTIAL/PLATELET
Basophils Absolute: 0 10*3/uL (ref 0.0–0.1)
Basophils Relative: 0 %
Eosinophils Absolute: 0 10*3/uL (ref 0.0–0.7)
Eosinophils Relative: 0 %
HCT: 27.5 % — ABNORMAL LOW (ref 36.0–46.0)
Hemoglobin: 8.9 g/dL — ABNORMAL LOW (ref 12.0–15.0)
Lymphocytes Relative: 8 %
Lymphs Abs: 1 10*3/uL (ref 0.7–4.0)
MCH: 30 pg (ref 26.0–34.0)
MCHC: 32.4 g/dL (ref 30.0–36.0)
MCV: 92.6 fL (ref 78.0–100.0)
Monocytes Absolute: 1 10*3/uL (ref 0.1–1.0)
Monocytes Relative: 7 %
Neutro Abs: 11.1 10*3/uL — ABNORMAL HIGH (ref 1.7–7.7)
Neutrophils Relative %: 85 %
Platelets: 216 10*3/uL (ref 150–400)
RBC: 2.97 MIL/uL — ABNORMAL LOW (ref 3.87–5.11)
RDW: 13 % (ref 11.5–15.5)
WBC: 13 10*3/uL — ABNORMAL HIGH (ref 4.0–10.5)

## 2016-02-01 LAB — GLUCOSE, CAPILLARY
GLUCOSE-CAPILLARY: 294 mg/dL — AB (ref 65–99)
GLUCOSE-CAPILLARY: 201 mg/dL — AB (ref 65–99)
GLUCOSE-CAPILLARY: 170 mg/dL — AB (ref 65–99)
Glucose-Capillary: 198 mg/dL — ABNORMAL HIGH (ref 65–99)
Glucose-Capillary: 117 mg/dL — ABNORMAL HIGH (ref 65–99)

## 2016-02-01 LAB — BASIC METABOLIC PANEL
Anion gap: 5 (ref 5–15)
BUN: 15 mg/dL (ref 6–20)
CHLORIDE: 107 mmol/L (ref 101–111)
CO2: 27 mmol/L (ref 22–32)
Calcium: 8.6 mg/dL — ABNORMAL LOW (ref 8.9–10.3)
Creatinine, Ser: 1.09 mg/dL — ABNORMAL HIGH (ref 0.44–1.00)
GFR calc non Af Amer: 47 mL/min — ABNORMAL LOW (ref >60)
GFR, EST AFRICAN AMERICAN: 54 mL/min — AB (ref >60)
Glucose, Bld: 275 mg/dL — ABNORMAL HIGH (ref 65–99)
POTASSIUM: 3.3 mmol/L — AB (ref 3.5–5.1)
SODIUM: 139 mmol/L (ref 135–145)

## 2016-02-01 LAB — PHENYTOIN LEVEL, TOTAL: Phenytoin Lvl: 21.2 ug/mL — ABNORMAL HIGH (ref 10.0–20.0)

## 2016-02-01 MED ORDER — KCL IN DEXTROSE-NACL 20-5-0.2 MEQ/L-%-% IV SOLN
INTRAVENOUS | Status: DC
  Administered 2016-02-01: 15:00:00 via INTRAVENOUS
  Filled 2016-02-01 (×2): qty 1000

## 2016-02-01 MED ORDER — INSULIN ASPART 100 UNIT/ML ~~LOC~~ SOLN
0.0000 [IU] | SUBCUTANEOUS | Status: DC
  Administered 2016-02-01: 5 [IU] via SUBCUTANEOUS
  Administered 2016-02-01: 3 [IU] via SUBCUTANEOUS
  Administered 2016-02-02: 5 [IU] via SUBCUTANEOUS
  Administered 2016-02-02: 3 [IU] via SUBCUTANEOUS

## 2016-02-01 MED ORDER — METOPROLOL TARTRATE 5 MG/5ML IV SOLN
5.0000 mg | Freq: Four times a day (QID) | INTRAVENOUS | Status: DC
  Administered 2016-02-01 – 2016-02-04 (×10): 5 mg via INTRAVENOUS
  Filled 2016-02-01 (×10): qty 5

## 2016-02-01 MED ORDER — PHENYTOIN SODIUM 50 MG/ML IJ SOLN
75.0000 mg | Freq: Three times a day (TID) | INTRAMUSCULAR | Status: DC
  Administered 2016-02-01 – 2016-02-04 (×8): 75 mg via INTRAVENOUS
  Filled 2016-02-01 (×11): qty 1.5

## 2016-02-01 MED ORDER — LEVOTHYROXINE SODIUM 100 MCG IV SOLR
25.0000 ug | Freq: Every day | INTRAVENOUS | Status: DC
  Administered 2016-02-02 – 2016-02-03 (×2): 25 ug via INTRAVENOUS
  Filled 2016-02-01 (×2): qty 5

## 2016-02-01 MED ORDER — HYDRALAZINE HCL 20 MG/ML IJ SOLN
10.0000 mg | INTRAMUSCULAR | Status: DC | PRN
  Administered 2016-02-01: 20 mg via INTRAVENOUS
  Administered 2016-02-02 – 2016-02-04 (×5): 40 mg via INTRAVENOUS
  Filled 2016-02-01 (×3): qty 2
  Filled 2016-02-01: qty 1
  Filled 2016-02-01 (×2): qty 2

## 2016-02-01 MED ORDER — SODIUM CHLORIDE 0.9 % IV SOLN
30.0000 meq | Freq: Once | INTRAVENOUS | Status: AC
  Administered 2016-02-01: 30 meq via INTRAVENOUS
  Filled 2016-02-01: qty 15

## 2016-02-01 MED ORDER — SODIUM CHLORIDE 0.9 % IV SOLN
INTRAVENOUS | Status: DC
  Administered 2016-02-01: 18:00:00 via INTRAVENOUS

## 2016-02-01 NOTE — Progress Notes (Signed)
LTM EEG report: "This 15 hours ofintensive EEG monitoring with simultaneous monitoring is abnormal for several reasons:background slowing c/w moderate encephalopathy of non specific etiologies  Relative left frontotemporal slowing might be suggestive of neuronal dysfunction in that region. "  Dilantin level borderline elevated at 21.2. Will decrease dosage to 75 mg TID and continue to follow levels.   Electronically signed: Dr. Kerney Elbe

## 2016-02-01 NOTE — Procedures (Signed)
Electroencephalogram report- LTM    Data acquisition: 10-20 electrode placement.  Additional T1, T2, and EKG electrodes; 26 channel digital referential acquisition reformatted to 18 channel/7 channel coronal bipolar     Spike detection: ON     Seizure detection: ON   Beginning time: 01/31/16 16 34 23  Ending time: 02/01/16 08 22 13 am   Day of study: day 1  This 15 hours of   intensive EEG monitoring with simultaneous video monitoring was performed for this patient with seizures.   Medications as per EMR  There was no pushbutton activations events during this recording.  Background activities were marked by 2-3 cps  delta slowing, alternating with emergence of faster frequencies 6-7 cps with posterior dominance c/w state changes. Relative left frontotemporal slowing was present . No IED, no seizures    Clinical interpretation: This 15 hours of   intensive EEG monitoring with simultaneous monitoring is abnormal for several reasons: background slowing c/w moderate encephalopathy of non specific etiologies  Relative left frontotemporal slowing might be suggestive of neuronal dysfunction in that region.  Clinical correlation is advised.

## 2016-02-01 NOTE — Progress Notes (Signed)
LTM EEG continues.  Pt checked for skin breakdown, none noted at A1, FP2.  Impedances checked and serviced. WNL.

## 2016-02-01 NOTE — Consult Note (Signed)
PULMONARY / CRITICAL CARE MEDICINE   Name: Kimberly Baker MRN: [id number] DOB: [birth year]    ADMISSION DATE:  01/28/2016 CONSULTATION DATE:  02/01/16  REFERRING MD:  Dr. Vertell Limber   CHIEF COMPLAINT:  Altered Mental Status   HISTORY OF PRESENT ILLNESS:   82 y/o F with PMH of anemia, arthritis, DM, GERD, headaches, HTN, hypothyroidism, neuropathy and chronic back pain s/p recent injection without relief.  She has a follow up MRI that was unable to be completed but did show advanced disc degeneration at L1-L2, L4-5 and L5-S1.  It also noted L4-5 foraminal impingement due to disc herniation and facet arthritis thought to be the cause of her right sided radiculopathy.  She was admitted on 12/22 for redo decompression with fusion, pedicle screw fixation & posterolateral arthrodesis.    Post procedure, she was alert and conversant per notes with good strength.  On 12/23, she reported headache & nausea.  She was on flat bedrest at that time.  She was treated with vistaril and zofran.  Daughter reports after vistaril, she went to sleep and has not been the same since.  Later in the am, NSGY was called back to assess the patient for mental status change > she was found to have a receptive aphasia, not following commands and a slight right hemiparesis.  STAT CT of the head was obtained, neurology & medicine consulted.  She was last known normal at 7pm on 12/22.  CT revealed a subarachnoid hemorrhage.  NSGY re-evaluated later in the afternoon and felt CT findings may be related to unintended durotomy.  Neurology felt the patients symptoms to be out of proportion to the amount of SAH.  STAT EEG was ordered which was consistent with a focal seizure in the left temporal lobe that resolved with ativan administration. She was loaded with fosphenytoin and keppra.  On 12/24, she was noted to be more somnolent and an MRI was obtained which demonstrated moderate volume subarachnoid and intraventricular hemorrhage similar to  recent CT, small acute infarcts in the left cingulate gyrus/posterior left frontal lobe, tiny right subdural hygroma without mass effect.  Blood pressures were increased overnight to 190's.    PCCM called for assistance with blood pressure management.   PAST MEDICAL HISTORY :  She  has a past medical history of Anemia; Arthritis; Cancer ([place]); Chronic kidney disease; Diabetes mellitus without complication ([place]); GERD (gastroesophageal reflux disease); Headache; Hypertension; Hypothyroidism; Neuropathy ([place]); Pneumonia; and PONV (postoperative nausea and vomiting).  PAST SURGICAL HISTORY: She  has a past surgical history that includes Mastoidectomy (11); Joint replacement (06); Bilateral oophorectomy; Abdominal hysterectomy; Foot surgery; Tympanostomy tube placement; Lumbar laminectomy/decompression microdiscectomy (01/30/2012); Cholecystectomy; and Eye surgery.  Allergies  Allergen Reactions  . Compazine [Prochlorperazine Edisylate] Other (See Comments)    Seizure  . Levaquin [Levofloxacin In D5w] Rash  . Tape Other (See Comments)    PAPER TAPE ONLY brusies skin    No current facility-administered medications on file prior to encounter.    Current Outpatient Prescriptions on File Prior to Encounter  Medication Sig  . calcium carbonate (OS-CAL) 600 MG TABS Take 1,200 mg by mouth 2 (two) times daily with a meal.   . carvedilol (COREG) 12.5 MG tablet Take 12.5 mg by mouth 2 (two) times daily with a meal.  . cetirizine (ZYRTEC) 10 MG tablet Take 10 mg by mouth daily.  . DULoxetine (CYMBALTA) 30 MG capsule Take 30 mg by mouth every evening.   . fenofibrate 160 MG tablet Take 160 mg  by mouth daily.  . fish oil-omega-3 fatty acids 1000 MG capsule Take 2 g by mouth 2 (two) times daily.  . furosemide (LASIX) 40 MG tablet Take 40 mg by mouth daily.  Marland Kitchen levothyroxine (SYNTHROID, LEVOTHROID) 50 MCG tablet Take 50 mcg by mouth daily.  . montelukast (SINGULAIR) 10 MG tablet Take 10 mg by mouth daily.    Marland Kitchen omeprazole (PRILOSEC) 20 MG capsule Take 20 mg by mouth daily.  . pravastatin (PRAVACHOL) 40 MG tablet Take 40 mg by mouth every evening.   . topiramate (TOPAMAX) 100 MG tablet Take 100 mg by mouth 2 (two) times daily.    FAMILY HISTORY:  Her has no family status information on file.    SOCIAL HISTORY: She  reports that she has never smoked. She has never used smokeless tobacco. She reports that she does not drink alcohol or use drugs.  REVIEW OF SYSTEMS:  Unable to complete with patient due to AMS.  Information obtained from chart review, staff and family at bedside.    SUBJECTIVE:   VITAL SIGNS: BP (!) 199/54 (BP Location: Left Leg)   Pulse 91   Temp 100.2 F (37.9 C) (Axillary)   Resp (!) 26   Ht 4\' 10"  (1.473 m)   Wt 133 lb 8 oz (60.6 kg)   SpO2 96%   BMI 27.90 kg/m   HEMODYNAMICS:    VENTILATOR SETTINGS:    INTAKE / OUTPUT: I/O last 3 completed shifts: In: 2585 [I.V.:2475; IV Piggyback:110] Out: 1125 [Urine:1125]  PHYSICAL EXAMINATION: General:  Elderly female in NAD lying in bed  Neuro:  No response to verbal stimuli, minimal moan with sternal rub, moves spontaneously, minimal movement of RUE noted HEENT:  MM pink/dry, able to manage secretions, swallowing appropriately without evidence of choking / gag Cardiovascular:  s1s2 rrr, no m/r/g  Lungs:  kussmaul's respirations, non-labored, lungs bilaterally clear  Abdomen:  Soft, non-distended, bsx4 active  Musculoskeletal:  No acute deformities  Skin:  Warm/dry, no edema   LABS:  BMET  Recent Labs Lab 01/30/16 0446 01/31/16 2139 02/01/16 0536  NA 137 139 139  K 2.9* 3.2* 3.3*  CL 102 105 107  CO2 23 26 27   BUN 13 16 15   CREATININE 1.20* 1.02* 1.09*  GLUCOSE 225* 197* 275*    Electrolytes  Recent Labs Lab 01/30/16 0446 01/31/16 2139 02/01/16 0536  CALCIUM 8.8* 8.6* 8.6*    CBC  Recent Labs Lab 01/30/16 0446  WBC 13.0*  HGB 9.9*  HCT 29.6*  PLT 229    Coag's No results for  input(s): APTT, INR in the last 168 hours.  Sepsis Markers No results for input(s): LATICACIDVEN, PROCALCITON, O2SATVEN in the last 168 hours.  ABG No results for input(s): PHART, PCO2ART, PO2ART in the last 168 hours.  Liver Enzymes  Recent Labs Lab 01/30/16 0446  AST 77*  ALT 37  ALKPHOS 42  BILITOT 1.0  ALBUMIN 3.1*    Cardiac Enzymes No results for input(s): TROPONINI, PROBNP in the last 168 hours.  Glucose  Recent Labs Lab 01/31/16 0740 01/31/16 1121 01/31/16 1655 01/31/16 2209 02/01/16 0748 02/01/16 1156  GLUCAP 244* 212* 175* 200* 294* 198*    Imaging No results found.   STUDIES:  12/23 CT Head >> small to mod subarachnoid hemorrhage.   12/23 EEG >> consistent with a focal seizure in the L temporal lobe that resolved with ativan administration.  12/24 MRI >> moderate volume subarachnoid and intraventricular hemorrhage similar to recent CT, small acute infarcts  in the left cingulate gyrus/posterior left frontal lobe, tiny right subdural hygroma without mass effect.  CULTURES:   ANTIBIOTICS:   SIGNIFICANT EVENTS: 12/22  Admit for lumbar decompression with fusion, pedicle screw fixation & posterolateral arthrodesis.   12/23  AMS with CT c/w SAH, EEG + for focal seizures 12/25  PCCM consulted for medical management   LINES/TUBES:   DISCUSSION: 82 y/o F admitted for lumbar decompression with fusion.  Post-operatively developed AMS and was found to have a SAH and focal seizures.    ASSESSMENT / PLAN:  NEUROLOGIC A:   Acute Encephalopathy - likely multifactorial in the setting of SAH, seizures, delirium, + sedating medications Small to Moderate SAH  Seizures  P:   RASS goal: 0 Minimize sedating medications as able  Defer work up to Neurology / NSGY Discontinue ativan, dilaudid, vistaril, robaxin, percocet, oxycodone  AED's per Neurology  Seizure precautions  Continuous EEG   PULMONARY A: At Risk Aspiration / Respiratory Failure - in the  setting of neurologic dysfunction  Acute Hypoxic Respiratory Failure  P:   Aspiration precautions  Discussed possibility of airway protection with family > they indicate understanding.  Trend CXR O2 as needed to support saturations > 92%  CARDIOVASCULAR A:  Hypertension  P:  Hold home norvasc, coreg, lasix Lopressor 5mg  IV Q6  PRN labetalol  SBP goal < 160   RENAL A:   Hypokalemia Hx CKD  P:   Hold home lasix  Trend BMP / UOP  Replace electrolytes as indicated  D51/2NS with 20 mEq KCL at 75 ml/hr  GASTROINTESTINAL A:   Hx GERD P:   NPO  Continue PPI   May need placement of core track for feeding, reassess neurologic status in am 12/26.    HEMATOLOGIC A:   Anemia  P:  Trend CBC  SCD's  No heparin with SAH  INFECTIOUS A:   At Risk Aspiration P:   Monitor fever curve / WBC  ENDOCRINE A:   Hypothyroidism  DM  P:   Continue synthroid > change to IV dosing  Adjust SSI to Q4  Continue lantus  FAMILY  - Updates: Daughters updated at bedside 12/25.  Granddaughter-in-law is a Immunologist at [employer] Nira Conn).    - Inter-disciplinary family meet or Palliative Care meeting due by:   1/1    Noe Gens, NP-C [facility] Pgr: [phone] or if no answer [phone] 02/01/2016, 2:53 PM

## 2016-02-01 NOTE — Progress Notes (Signed)
Subjective: Patient continues to be monitored in stepdown unit. Continues on continuous EEG monitoring. Continues on Dilantin and Keppra.  Objective: Vital signs in last 24 hours: Vitals:   01/31/16 2347 02/01/16 0354 02/01/16 0635 02/01/16 0800  BP: (!) 180/37 (!) 178/40 (!) 156/53 (!) 187/41  Pulse: 83 76 80 75  Resp: (!) 26 (!) 27 (!) 26   Temp: (!) 100.7 F (38.2 C) 99.3 F (37.4 C)  98.4 F (36.9 C)  TempSrc: Axillary Axillary  Axillary  SpO2: 95% 96% 97% 96%  Weight:      Height:        Intake/Output from previous day: 12/24 0701 - 12/25 0700 In: 1650 [I.V.:1650] Out: 725 [Urine:725] Intake/Output this shift: Total I/O In: 75 [I.V.:75] Out: -   Physical Exam:  Not opening eyes to voice. Purposeful movement of extremities, moving left side somewhat more than right side. Pupils 3 mm bilaterally, round, reactive to light.  CBC  Recent Labs  01/30/16 0446  WBC 13.0*  HGB 9.9*  HCT 29.6*  PLT 229   BMET  Recent Labs  01/31/16 2139 02/01/16 0536  NA 139 139  K 3.2* 3.3*  CL 105 107  CO2 26 27  GLUCOSE 197* 275*  BUN 16 15  CREATININE 1.02* 1.09*  CALCIUM 8.6* 8.6*    Studies/Results: Mr Brain Wo Contrast  Result Date: 01/31/2016 CLINICAL DATA:  Aphasia. Altered mental status with subarachnoid hemorrhage on CT. Recent lumbar spine surgery. Possible seizure activity in the left temporal lobe on EEG. EXAM: MRI HEAD WITHOUT CONTRAST TECHNIQUE: Multiplanar, multiecho pulse sequences of the brain and surrounding structures were obtained without intravenous contrast. COMPARISON:  Head CT 01/30/2016 FINDINGS: Multiple sequences are mildly to moderately motion degraded. Brain: There are small acute infarcts in the left cingulate gyrus and subcortical posterior left frontal lobe. Moderate volume subarachnoid hemorrhage is again seen bilaterally involving cerebral sulci and sylvian fissures, relatively equal between the right and left sides. Blood is again seen in  the occipital horns of both lateral ventricles. A chronic lacunar infarct is again seen in the right corona radiata. T2 hyperintensities elsewhere in the cerebral white matter bilaterally are nonspecific but compatible with chronic small vessel ischemic disease, mild for age. There is mild cerebral atrophy. No mass or midline shift is present. Small volume pneumocephalus is again seen. Small subdural collection over the right cerebral hemisphere measures 3 mm in thickness in the frontal region and appears to follows CSF signal intensity on all sequences within limitations of motion artifact. Dedicated thin section imaging through the temporal lobes is degraded by motion artifact, precluding detailed assessment of the mesial temporal lobe structures. Vascular: Major intracranial vascular flow voids are preserved. Skull and upper cervical spine: No focal marrow lesion. Sinuses/Orbits: Prior bilateral cataract extraction. Chronic sinusitis with extensive ethmoid and maxillary sinus opacification as described on recent CT. Moderate right and small left mastoid effusions. Other: None. IMPRESSION: 1. Moderate volume subarachnoid and intraventricular hemorrhage, similar to the recent CT. 2. Small acute infarcts in the left cingulate gyrus and posterior left frontal lobe. 3. Tiny right-sided subdural hygroma without mass effect. Pneumocephalus from recent spine surgery. 4. Mild chronic small vessel ischemic disease and cerebral atrophy. Electronically Signed   By: Logan Bores M.D.   On: 01/31/2016 12:49   Dg Chest Port 1 View  Result Date: 01/30/2016 CLINICAL DATA:  Lethargy, history hypertension, diabetes mellitus, chronic kidney disease, GERD EXAM: PORTABLE CHEST 1 VIEW COMPARISON:  Portable exam 1039 hours compared to  01/24/2012 FINDINGS: Rotated to the LEFT. Upper normal heart size. Mediastinal contours and pulmonary vascularity normal. Atherosclerotic calcification aorta. Lungs grossly clear. No pleural effusion  or pneumothorax. Extensive costal cartilaginous calcification. Bones demineralized with evidence of prior cervical spine fusion. IMPRESSION: No acute abnormalities. Aortic atherosclerosis. Electronically Signed   By: Lavonia Dana M.D.   On: 01/30/2016 12:17    Assessment/Plan: Compared to recent notes, neurologically without significant change. Appreciate Dr. Oralia Rud assistance. Did speak with the patient's 2 daughters at bedside. Continuing supportive care.   Hosie Spangle, MD 02/01/2016, 10:45 AM

## 2016-02-01 NOTE — Progress Notes (Signed)
Pt BPs 180-190s/40s.  MD Eshraghi paged and PRN orders and parameters to keep SBP <160 DBP <90 given.  Will continue to monitor and update team.

## 2016-02-02 ENCOUNTER — Inpatient Hospital Stay (HOSPITAL_COMMUNITY): Payer: Medicare Other

## 2016-02-02 DIAGNOSIS — G9340 Encephalopathy, unspecified: Secondary | ICD-10-CM

## 2016-02-02 DIAGNOSIS — J9601 Acute respiratory failure with hypoxia: Secondary | ICD-10-CM

## 2016-02-02 LAB — GLUCOSE, CAPILLARY
GLUCOSE-CAPILLARY: 265 mg/dL — AB (ref 65–99)
Glucose-Capillary: 195 mg/dL — ABNORMAL HIGH (ref 65–99)
Glucose-Capillary: 201 mg/dL — ABNORMAL HIGH (ref 65–99)
Glucose-Capillary: 205 mg/dL — ABNORMAL HIGH (ref 65–99)

## 2016-02-02 LAB — PHOSPHORUS: Phosphorus: <1 mg/dL — CL (ref 2.5–4.6)

## 2016-02-02 LAB — CBC
HCT: 27.5 % — ABNORMAL LOW (ref 36.0–46.0)
Hemoglobin: 8.9 g/dL — ABNORMAL LOW (ref 12.0–15.0)
MCH: 30.3 pg (ref 26.0–34.0)
MCHC: 32.4 g/dL (ref 30.0–36.0)
MCV: 93.5 fL (ref 78.0–100.0)
PLATELETS: 226 10*3/uL (ref 150–400)
RBC: 2.94 MIL/uL — ABNORMAL LOW (ref 3.87–5.11)
RDW: 13.3 % (ref 11.5–15.5)
WBC: 11.1 10*3/uL — ABNORMAL HIGH (ref 4.0–10.5)

## 2016-02-02 LAB — BASIC METABOLIC PANEL
Anion gap: 13 (ref 5–15)
BUN: 17 mg/dL (ref 6–20)
CHLORIDE: 111 mmol/L (ref 101–111)
CO2: 18 mmol/L — AB (ref 22–32)
CREATININE: 1.16 mg/dL — AB (ref 0.44–1.00)
Calcium: 8.8 mg/dL — ABNORMAL LOW (ref 8.9–10.3)
GFR calc Af Amer: 50 mL/min — ABNORMAL LOW (ref >60)
GFR calc non Af Amer: 43 mL/min — ABNORMAL LOW (ref >60)
Glucose, Bld: 244 mg/dL — ABNORMAL HIGH (ref 65–99)
Potassium: 3.4 mmol/L — ABNORMAL LOW (ref 3.5–5.1)
Sodium: 142 mmol/L (ref 135–145)

## 2016-02-02 LAB — PHENYTOIN LEVEL, TOTAL: Phenytoin Lvl: 19.5 ug/mL (ref 10.0–20.0)

## 2016-02-02 LAB — MAGNESIUM: Magnesium: 1.7 mg/dL (ref 1.7–2.4)

## 2016-02-02 MED ORDER — POTASSIUM CHLORIDE 2 MEQ/ML IV SOLN
30.0000 meq | Freq: Once | INTRAVENOUS | Status: AC
  Administered 2016-02-02: 30 meq via INTRAVENOUS
  Filled 2016-02-02: qty 15

## 2016-02-02 MED ORDER — INSULIN ASPART 100 UNIT/ML ~~LOC~~ SOLN
3.0000 [IU] | SUBCUTANEOUS | Status: DC | PRN
  Administered 2016-02-03 – 2016-02-04 (×3): 10 [IU] via SUBCUTANEOUS
  Filled 2016-02-02 (×3): qty 0.1

## 2016-02-02 MED ORDER — SODIUM PHOSPHATES 45 MMOLE/15ML IV SOLN
40.0000 meq | Freq: Once | INTRAVENOUS | Status: AC
  Administered 2016-02-02: 40 meq via INTRAVENOUS
  Filled 2016-02-02: qty 10

## 2016-02-02 MED ORDER — LABETALOL HCL 5 MG/ML IV SOLN
20.0000 mg | INTRAVENOUS | Status: DC | PRN
  Administered 2016-02-02 – 2016-02-03 (×3): 20 mg via INTRAVENOUS
  Filled 2016-02-02 (×3): qty 4

## 2016-02-02 MED ORDER — NIMODIPINE 30 MG PO CAPS
60.0000 mg | ORAL_CAPSULE | ORAL | Status: DC
  Filled 2016-02-02: qty 2

## 2016-02-02 MED ORDER — FUROSEMIDE 10 MG/ML IJ SOLN
20.0000 mg | Freq: Once | INTRAMUSCULAR | Status: AC
  Administered 2016-02-02: 20 mg via INTRAVENOUS
  Filled 2016-02-02: qty 2

## 2016-02-02 NOTE — Progress Notes (Signed)
Subjective: No significant events. No subjective improvement per nursing and family.   Objective: Current vital signs: BP (!) 137/46   Pulse 89   Temp 99.9 F (37.7 C) (Axillary)   Resp (!) 31   Ht '4\' 10"'$  (1.473 m)   Wt 60.6 kg (133 lb 8 oz)   SpO2 100%   BMI 27.90 kg/m  Vital signs in last 24 hours: Temp:  [98.6 F (37 C)-101.3 F (38.5 C)] 99.9 F (37.7 C) (12/26 0700) Pulse Rate:  [82-93] 89 (12/26 1100) Resp:  [23-32] 31 (12/26 1100) BP: (137-199)/(28-77) 137/46 (12/26 1100) SpO2:  [96 %-100 %] 100 % (12/26 1100)  Intake/Output from previous day: 12/25 0701 - 12/26 0700 In: 1432.1 [I.V.:1002.1; IV Piggyback:430] Out: 700 [Urine:700] Intake/Output this shift: Total I/O In: 560 [I.V.:300; IV Piggyback:260] Out: -  Nutritional status:    Neurologic Exam: Ment: Eyes closed, do not open to voice or with tactile stimulation. No spontaneous movement. Does not attempt to communicate.  CN: PERRL. Does not fixate on visual stimuli. No blink to threat. Grimaces to noxious stimuli.  Motor/Sensory: Semipurposeful upper extremity movements to noxious stimuli. Weakly responds to noxious stimuli applied to lower extremities.   Lab Results: Results for orders placed or performed during the hospital encounter of 01/10/2016 (from the past 48 hour(s))  Glucose, capillary     Status: Abnormal   Collection Time: 01/31/16  4:55 PM  Result Value Ref Range   Glucose-Capillary 175 (H) 65 - 99 mg/dL   Comment 1 Notify RN    Comment 2 Document in Chart   Basic metabolic panel     Status: Abnormal   Collection Time: 01/31/16  9:39 PM  Result Value Ref Range   Sodium 139 135 - 145 mmol/L   Potassium 3.2 (L) 3.5 - 5.1 mmol/L   Chloride 105 101 - 111 mmol/L   CO2 26 22 - 32 mmol/L   Glucose, Bld 197 (H) 65 - 99 mg/dL   BUN 16 6 - 20 mg/dL   Creatinine, Ser 1.02 (H) 0.44 - 1.00 mg/dL   Calcium 8.6 (L) 8.9 - 10.3 mg/dL   GFR calc non Af Amer 51 (L) >60 mL/min   GFR calc Af Amer 59 (L) >60  mL/min    Comment: (NOTE) The eGFR has been calculated using the CKD EPI equation. This calculation has not been validated in all clinical situations. eGFR's persistently <60 mL/min signify possible Chronic Kidney Disease.    Anion gap 8 5 - 15  Glucose, capillary     Status: Abnormal   Collection Time: 01/31/16 10:09 PM  Result Value Ref Range   Glucose-Capillary 200 (H) 65 - 99 mg/dL   Comment 1 Notify RN   Phenytoin level, total     Status: Abnormal   Collection Time: 02/01/16  5:36 AM  Result Value Ref Range   Phenytoin Lvl 21.2 (H) 10.0 - 20.0 ug/mL  Basic metabolic panel     Status: Abnormal   Collection Time: 02/01/16  5:36 AM  Result Value Ref Range   Sodium 139 135 - 145 mmol/L   Potassium 3.3 (L) 3.5 - 5.1 mmol/L   Chloride 107 101 - 111 mmol/L   CO2 27 22 - 32 mmol/L   Glucose, Bld 275 (H) 65 - 99 mg/dL   BUN 15 6 - 20 mg/dL   Creatinine, Ser 1.09 (H) 0.44 - 1.00 mg/dL   Calcium 8.6 (L) 8.9 - 10.3 mg/dL   GFR calc non Af Amer 47 (  L) >60 mL/min   GFR calc Af Amer 54 (L) >60 mL/min    Comment: (NOTE) The eGFR has been calculated using the CKD EPI equation. This calculation has not been validated in all clinical situations. eGFR's persistently <60 mL/min signify possible Chronic Kidney Disease.    Anion gap 5 5 - 15  Glucose, capillary     Status: Abnormal   Collection Time: 02/01/16  7:48 AM  Result Value Ref Range   Glucose-Capillary 294 (H) 65 - 99 mg/dL  Glucose, capillary     Status: Abnormal   Collection Time: 02/01/16 11:56 AM  Result Value Ref Range   Glucose-Capillary 198 (H) 65 - 99 mg/dL  Glucose, capillary     Status: Abnormal   Collection Time: 02/01/16  4:14 PM  Result Value Ref Range   Glucose-Capillary 201 (H) 65 - 99 mg/dL  CBC with Differential/Platelet     Status: Abnormal   Collection Time: 02/01/16  5:56 PM  Result Value Ref Range   WBC 13.0 (H) 4.0 - 10.5 K/uL   RBC 2.97 (L) 3.87 - 5.11 MIL/uL   Hemoglobin 8.9 (L) 12.0 - 15.0 g/dL    HCT 27.5 (L) 36.0 - 46.0 %   MCV 92.6 78.0 - 100.0 fL   MCH 30.0 26.0 - 34.0 pg   MCHC 32.4 30.0 - 36.0 g/dL   RDW 13.0 11.5 - 15.5 %   Platelets 216 150 - 400 K/uL   Neutrophils Relative % 85 %   Neutro Abs 11.1 (H) 1.7 - 7.7 K/uL   Lymphocytes Relative 8 %   Lymphs Abs 1.0 0.7 - 4.0 K/uL   Monocytes Relative 7 %   Monocytes Absolute 1.0 0.1 - 1.0 K/uL   Eosinophils Relative 0 %   Eosinophils Absolute 0.0 0.0 - 0.7 K/uL   Basophils Relative 0 %   Basophils Absolute 0.0 0.0 - 0.1 K/uL  Glucose, capillary     Status: Abnormal   Collection Time: 02/01/16  7:59 PM  Result Value Ref Range   Glucose-Capillary 170 (H) 65 - 99 mg/dL   Comment 1 Notify RN    Comment 2 Document in Chart   Glucose, capillary     Status: Abnormal   Collection Time: 02/01/16 10:54 PM  Result Value Ref Range   Glucose-Capillary 117 (H) 65 - 99 mg/dL  Glucose, capillary     Status: Abnormal   Collection Time: 02/02/16  3:07 AM  Result Value Ref Range   Glucose-Capillary 195 (H) 65 - 99 mg/dL  Phenytoin level, total     Status: None   Collection Time: 02/02/16  5:01 AM  Result Value Ref Range   Phenytoin Lvl 19.5 10.0 - 20.0 ug/mL  Basic metabolic panel     Status: Abnormal   Collection Time: 02/02/16  5:01 AM  Result Value Ref Range   Sodium 142 135 - 145 mmol/L   Potassium 3.4 (L) 3.5 - 5.1 mmol/L   Chloride 111 101 - 111 mmol/L   CO2 18 (L) 22 - 32 mmol/L   Glucose, Bld 244 (H) 65 - 99 mg/dL   BUN 17 6 - 20 mg/dL   Creatinine, Ser 1.16 (H) 0.44 - 1.00 mg/dL   Calcium 8.8 (L) 8.9 - 10.3 mg/dL   GFR calc non Af Amer 43 (L) >60 mL/min   GFR calc Af Amer 50 (L) >60 mL/min    Comment: (NOTE) The eGFR has been calculated using the CKD EPI equation. This calculation has not been validated  in all clinical situations. eGFR's persistently <60 mL/min signify possible Chronic Kidney Disease.    Anion gap 13 5 - 15  CBC     Status: Abnormal   Collection Time: 02/02/16  5:01 AM  Result Value Ref Range    WBC 11.1 (H) 4.0 - 10.5 K/uL   RBC 2.94 (L) 3.87 - 5.11 MIL/uL   Hemoglobin 8.9 (L) 12.0 - 15.0 g/dL   HCT 27.5 (L) 36.0 - 46.0 %   MCV 93.5 78.0 - 100.0 fL   MCH 30.3 26.0 - 34.0 pg   MCHC 32.4 30.0 - 36.0 g/dL   RDW 13.3 11.5 - 15.5 %   Platelets 226 150 - 400 K/uL  Magnesium     Status: None   Collection Time: 02/02/16  5:01 AM  Result Value Ref Range   Magnesium 1.7 1.7 - 2.4 mg/dL  Phosphorus     Status: Abnormal   Collection Time: 02/02/16  5:01 AM  Result Value Ref Range   Phosphorus <1.0 (LL) 2.5 - 4.6 mg/dL    Comment: REPEATED TO VERIFY CRITICAL RESULT CALLED TO, READ BACK BY AND VERIFIED WITH: IRBY T,RN 02/02/16 0611 WAYK   Glucose, capillary     Status: Abnormal   Collection Time: 02/02/16  7:48 AM  Result Value Ref Range   Glucose-Capillary 201 (H) 65 - 99 mg/dL   Comment 1 Notify RN    Comment 2 Document in Chart     No results found for this or any previous visit (from the past 240 hour(s)).  Lipid Panel No results for input(s): CHOL, TRIG, HDL, CHOLHDL, VLDL, LDLCALC in the last 72 hours.  Studies/Results: Mr Brain Wo Contrast  Result Date: 01/31/2016 CLINICAL DATA:  Aphasia. Altered mental status with subarachnoid hemorrhage on CT. Recent lumbar spine surgery. Possible seizure activity in the left temporal lobe on EEG. EXAM: MRI HEAD WITHOUT CONTRAST TECHNIQUE: Multiplanar, multiecho pulse sequences of the brain and surrounding structures were obtained without intravenous contrast. COMPARISON:  Head CT 01/30/2016 FINDINGS: Multiple sequences are mildly to moderately motion degraded. Brain: There are small acute infarcts in the left cingulate gyrus and subcortical posterior left frontal lobe. Moderate volume subarachnoid hemorrhage is again seen bilaterally involving cerebral sulci and sylvian fissures, relatively equal between the right and left sides. Blood is again seen in the occipital horns of both lateral ventricles. A chronic lacunar infarct is again  seen in the right corona radiata. T2 hyperintensities elsewhere in the cerebral white matter bilaterally are nonspecific but compatible with chronic small vessel ischemic disease, mild for age. There is mild cerebral atrophy. No mass or midline shift is present. Small volume pneumocephalus is again seen. Small subdural collection over the right cerebral hemisphere measures 3 mm in thickness in the frontal region and appears to follows CSF signal intensity on all sequences within limitations of motion artifact. Dedicated thin section imaging through the temporal lobes is degraded by motion artifact, precluding detailed assessment of the mesial temporal lobe structures. Vascular: Major intracranial vascular flow voids are preserved. Skull and upper cervical spine: No focal marrow lesion. Sinuses/Orbits: Prior bilateral cataract extraction. Chronic sinusitis with extensive ethmoid and maxillary sinus opacification as described on recent CT. Moderate right and small left mastoid effusions. Other: None. IMPRESSION: 1. Moderate volume subarachnoid and intraventricular hemorrhage, similar to the recent CT. 2. Small acute infarcts in the left cingulate gyrus and posterior left frontal lobe. 3. Tiny right-sided subdural hygroma without mass effect. Pneumocephalus from recent spine surgery. 4. Mild chronic  small vessel ischemic disease and cerebral atrophy. Electronically Signed   By: Logan Bores M.D.   On: 01/31/2016 12:49   Dg Chest Port 1 View  Result Date: 02/02/2016 CLINICAL DATA:  Seizure EXAM: PORTABLE CHEST 1 VIEW COMPARISON:  01/30/2016 FINDINGS: Diffuse airspace disease and vascular congestion in a pulmonary edema pattern. No pneumothorax. Upper normal heart size. IMPRESSION: Diffuse airspace disease and vascular congestion in a pulmonary edema pattern. Electronically Signed   By: Marybelle Killings M.D.   On: 02/02/2016 07:53    Medications: No current facility-administered medications on file prior to  encounter.    Current Outpatient Prescriptions on File Prior to Encounter  Medication Sig Dispense Refill  . calcium carbonate (OS-CAL) 600 MG TABS Take 1,200 mg by mouth 2 (two) times daily with a meal.     . carvedilol (COREG) 12.5 MG tablet Take 12.5 mg by mouth 2 (two) times daily with a meal.    . cetirizine (ZYRTEC) 10 MG tablet Take 10 mg by mouth daily.    . DULoxetine (CYMBALTA) 30 MG capsule Take 30 mg by mouth every evening.     . fenofibrate 160 MG tablet Take 160 mg by mouth daily.    . fish oil-omega-3 fatty acids 1000 MG capsule Take 2 g by mouth 2 (two) times daily.    . furosemide (LASIX) 40 MG tablet Take 40 mg by mouth daily.    Marland Kitchen levothyroxine (SYNTHROID, LEVOTHROID) 50 MCG tablet Take 50 mcg by mouth daily.    . montelukast (SINGULAIR) 10 MG tablet Take 10 mg by mouth daily.     Marland Kitchen omeprazole (PRILOSEC) 20 MG capsule Take 20 mg by mouth daily.    . pravastatin (PRAVACHOL) 40 MG tablet Take 40 mg by mouth every evening.     . topiramate (TOPAMAX) 100 MG tablet Take 100 mg by mouth 2 (two) times daily.       Assessment: 82 yo F with altered mental status in the setting of subarachnoid hemorrhage following CSF leak.  1. Initially may have been having recurrent seizures, but EEG showed no electrographic seizures. A benzodiazepine challenge did not result in changes to the EEG pattern. Of note, EEG background slowing c/w moderate encephalopathy of non specific etiologies; relative left frontotemporal slowing might be suggestive of neuronal dysfunction in that region.  2. MRI brain revealed no change to previously seen blood product distribution previously seen on CT. Small cortically based foci of restricted diffusion appeared most consistent with small cortical strokes, likely secondary to vasospasm, but could also represent cytotoxic edema secondary to persistent seizure activity initially.   Recommendations: 1. Continue Keppra and Dilantin. Dilantin level 19.5 today  (12/26).  2. Discussed prognosis with family. With time, clearance of blood products may result in improvement. Long term neurological outcome may also be poor. Will require more recovery time to better assess prognosis.  3. Start nimodipine as prophylaxis against vasospasm (60 mg per NGT q4h x 21 days).  4. Discontinuing continuous EEG.    LOS: 4 days   '@Electronically'$  signed: Dr. Kerney Elbe 02/02/2016  11:26 AM

## 2016-02-02 NOTE — Progress Notes (Signed)
OT Cancellation Note  Patient Details Name: Kimberly Baker MRN: [id number] DOB: 1933-06-22   Cancelled Treatment:    Reason Eval/Treat Not Completed: Patient not medically ready (bedrest)  Peri Maris  [id number] 02/02/2016, 7:34 AM

## 2016-02-02 NOTE — Care Management Important Message (Signed)
Important Message  Patient Details  Name: Kimberly Baker MRN: [id number] Date of Birth: 09/03/1933   Medicare Important Message Given:  Yes    Kimberly Baker 02/02/2016, 12:51 PM

## 2016-02-02 NOTE — Progress Notes (Addendum)
PT Cancellation Note  Patient Details Name: Kimberly Baker MRN: [id number] DOB: 23-Jun-1933   Cancelled Treatment:    Reason Eval/Treat Not Completed: Medical issues which prohibited therapy; Remains on bedrest and may be intubated and transferred to ICU. Will sign off and await new orders when stable.   Reginia Naas 02/02/2016, 2:06 PM  Magda Kiel, [place] 02/02/2016

## 2016-02-02 NOTE — Progress Notes (Signed)
[facility] Progress Note Patient Name: Kimberly Baker DOB: Nov 12, 1933 MRN: [id number]   Date of Service  02/02/2016  HPI/Events of Note  Phos<1, k=3.4  eICU Interventions  Replace lytes     Intervention Category Major Interventions: Electrolyte abnormality - evaluation and management  Flora Lipps 02/02/2016, 6:57 AM

## 2016-02-02 NOTE — Progress Notes (Signed)
[facility] Progress Note Patient Name: Kimberly Baker DOB: 03-13-1933 MRN: [id number]   Date of Service  02/02/2016  HPI/Events of Note  SBP>180  eICU Interventions  Will increase dosage of labetolol and hydralazine     Intervention Category Major Interventions: Hypertension - evaluation and management  [name] 02/02/2016, 1:45 AM

## 2016-02-02 NOTE — Progress Notes (Signed)
PULMONARY / CRITICAL CARE MEDICINE   Name: Kimberly Baker MRN: [id number] DOB: Apr 26, 1933    ADMISSION DATE:  01/19/2016 CONSULTATION DATE:  02/01/16  REFERRING MD:  Dr. Vertell Limber   CHIEF COMPLAINT:  Altered Mental Status   HISTORY OF PRESENT ILLNESS:   82 y/o F with PMH of anemia, arthritis, DM, GERD, headaches, HTN, hypothyroidism, neuropathy and chronic back pain s/p recent injection without relief.  She has a follow up MRI that was unable to be completed but did show advanced disc degeneration at L1-L2, L4-5 and L5-S1.  It also noted L4-5 foraminal impingement due to disc herniation and facet arthritis thought to be the cause of her right sided radiculopathy.  She was admitted on 12/22 for redo decompression with fusion, pedicle screw fixation & posterolateral arthrodesis.    Post procedure, she was alert and conversant per notes with good strength.  On 12/23, she reported headache & nausea.  She was on flat bedrest at that time.  She was treated with vistaril and zofran.  Daughter reports after vistaril, she went to sleep and has not been the same since.  Later in the am, NSGY was called back to assess the patient for mental status change > she was found to have a receptive aphasia, not following commands and a slight right hemiparesis.  STAT CT of the head was obtained, neurology & medicine consulted.  She was last known normal at 7pm on 12/22.  CT revealed a subarachnoid hemorrhage.  NSGY re-evaluated later in the afternoon and felt CT findings may be related to unintended durotomy.  Neurology felt the patients symptoms to be out of proportion to the amount of SAH.  STAT EEG was ordered which was consistent with a focal seizure in the left temporal lobe that resolved with ativan administration. She was loaded with fosphenytoin and keppra.  On 12/24, she was noted to be more somnolent and an MRI was obtained which demonstrated moderate volume subarachnoid and intraventricular hemorrhage similar to  recent CT, small acute infarcts in the left cingulate gyrus/posterior left frontal lobe, tiny right subdural hygroma without mass effect.  Blood pressures were increased overnight to 190's.    PCCM called for assistance with blood pressure management.   SUBJECTIVE: Family reports no real change overnight. RN states thick brown respiratory secretions.  VITAL SIGNS: BP (!) 138/45 (BP Location: Left Arm)   Pulse 89   Temp 99.9 F (37.7 C) (Axillary)   Resp (!) 30   Ht 4\' 10"  (1.473 m)   Wt 60.6 kg (133 lb 8 oz)   SpO2 100%   BMI 27.90 kg/m   HEMODYNAMICS:    VENTILATOR SETTINGS:    INTAKE / OUTPUT: I/O last 3 completed shifts: In: 2332.1 [I.V.:1902.1; IV Piggyback:430] Out: 1025 [Urine:1025]  PHYSICAL EXAMINATION: General:  Elderly female in NAD lying in bed  Neuro:  No response to verbal stimuli, minimal moan with sternal rub, moves spontaneously, minimal movement of RUE noted HEENT:  MM pink/dry, able to manage secretions, swallowing appropriately without evidence of choking / gag Cardiovascular:  s1s2 rrr, no m/r/g  Lungs:  kussmaul's respirations, non-labored, lungs bilaterally clear  Abdomen:  Soft, non-distended, bsx4 active  Musculoskeletal:  No acute deformities  Skin:  Warm/dry, no edema   LABS:  BMET  Recent Labs Lab 01/31/16 2139 02/01/16 0536 02/02/16 0501  NA 139 139 142  K 3.2* 3.3* 3.4*  CL 105 107 111  CO2 26 27 18*  BUN 16 15 17   CREATININE 1.02*  1.09* 1.16*  GLUCOSE 197* 275* 244*    Electrolytes  Recent Labs Lab 01/31/16 2139 02/01/16 0536 02/02/16 0501  CALCIUM 8.6* 8.6* 8.8*  MG  --   --  1.7  PHOS  --   --  <1.0*    CBC  Recent Labs Lab 01/30/16 0446 02/01/16 1756 02/02/16 0501  WBC 13.0* 13.0* 11.1*  HGB 9.9* 8.9* 8.9*  HCT 29.6* 27.5* 27.5*  PLT 229 216 226    Coag's No results for input(s): APTT, INR in the last 168 hours.  Sepsis Markers No results for input(s): LATICACIDVEN, PROCALCITON, O2SATVEN in the last  168 hours.  ABG No results for input(s): PHART, PCO2ART, PO2ART in the last 168 hours.  Liver Enzymes  Recent Labs Lab 01/30/16 0446  AST 77*  ALT 37  ALKPHOS 42  BILITOT 1.0  ALBUMIN 3.1*    Cardiac Enzymes No results for input(s): TROPONINI, PROBNP in the last 168 hours.  Glucose  Recent Labs Lab 02/01/16 1156 02/01/16 1614 02/01/16 1959 02/01/16 2254 02/02/16 0307 02/02/16 0748  GLUCAP 198* 201* 170* 117* 195* 201*    Imaging Dg Chest Port 1 View  Result Date: 02/02/2016 CLINICAL DATA:  Seizure EXAM: PORTABLE CHEST 1 VIEW COMPARISON:  01/30/2016 FINDINGS: Diffuse airspace disease and vascular congestion in a pulmonary edema pattern. No pneumothorax. Upper normal heart size. IMPRESSION: Diffuse airspace disease and vascular congestion in a pulmonary edema pattern. Electronically Signed   By: Marybelle Killings M.D.   On: 02/02/2016 07:53     STUDIES:  12/23 CT Head >> small to mod subarachnoid hemorrhage.   12/23 EEG >> consistent with a focal seizure in the L temporal lobe that resolved with ativan administration.  12/24 MRI >> moderate volume subarachnoid and intraventricular hemorrhage similar to recent CT, small acute infarcts in the left cingulate gyrus/posterior left frontal lobe, tiny right subdural hygroma without mass effect. 12/26 EEG >  abnormal continuous video EEG due to loss of normal background and diffuse slow activity, no epileptiform discharges    CULTURES: Sputum 12/16 >  ANTIBIOTICS:   SIGNIFICANT EVENTS: 12/22  Admit for lumbar decompression with fusion, pedicle screw fixation & posterolateral arthrodesis.   12/23  AMS with CT c/w SAH, EEG + for focal seizures 12/25  PCCM consulted for medical management   LINES/TUBES:   DISCUSSION: 82 y/o F admitted for lumbar decompression with fusion.  Post-operatively developed AMS and was found to have a SAH and focal seizures.    ASSESSMENT / PLAN:  NEUROLOGIC A:   Acute Encephalopathy -  likely multifactorial in the setting of SAH, seizures, delirium, + sedating medications Small to Moderate SAH  Seizures  P:   RASS goal: 0 Minimize sedating medications as able  Defer work up to Neurology / NSGY Discontinue ativan, dilaudid, vistaril, robaxin, percocet, oxycodone  AED's per Neurology  Seizure precautions  CT scan repeat today, will make [place] decisions based on this.   PULMONARY A: At Risk Aspiration / Respiratory Failure - in the setting of neurologic dysfunction  Acute Hypoxic Respiratory Failure  P:   Aspiration precautions  Discussed possibility of airway protection with family > they indicate understanding.  Trend CXR O2 as needed to support saturations > 92% Now thick sputum > culture, hold off ABX  CARDIOVASCULAR A:  Hypertension > improved 12/26 P:  Hold home norvasc, coreg, lasix Lopressor 5mg  IV Q6  PRN labetalol  SBP goal < 160   RENAL A:   Hypokalemia Hx CKD  P:  Hold home lasix  Trend BMP / UOP  Replace electrolytes as indicated  D51/2NS with 20 mEq KCL at 75 ml/hr  GASTROINTESTINAL A:   Hx GERD P:   NPO  Continue PPI   May need placement of core track for feeding, will discuss after CT results.    HEMATOLOGIC A:   Anemia  P:  Trend CBC  SCD's  No heparin with SAH  INFECTIOUS A:   At Risk Aspiration P:   Monitor fever curve / WBC  ENDOCRINE A:   Hypothyroidism  DM  P:   Continue synthroid > change to IV dosing  Adjust SSI to Q4. SSI to resistant. May need to escalate to infusion. Will stop Lantus as NPO  FAMILY  - Updates: Daughters updated at bedside 12/26.  Granddaughter-in-law is a Immunologist at [employer] Nira Conn).    - Inter-disciplinary family meet or Palliative Care meeting due by:   1/1  Georgann Housekeeper, AGACNP-BC [facility] Pulmonology/Critical Care Pager [phone] or [phone]  02/02/2016 10:58 AM

## 2016-02-02 NOTE — Progress Notes (Signed)
Subjective: Patient remained to the stepdown unit. One of patient's daughters at bedside, she does not note any improvement in her mother's condition. Extended EEG monitoring discontinued.  Results per neurology documented in their notes (slowing noted, but no seizure activity).  Objective: Vital signs in last 24 hours: Vitals:   02/02/16 0500 02/02/16 0600 02/02/16 0630 02/02/16 0700  BP: (!) 167/50 (!) 192/53 (!) 180/50 (!) 167/28  Pulse: 82 84 88 88  Resp: (!) 30 (!) 26 (!) 31 (!) 32  Temp:    99.9 F (37.7 C)  TempSrc:    Axillary  SpO2: 100% 98% 98% 97%  Weight:      Height:        Intake/Output from previous day: 12/25 0701 - 12/26 0700 In: 1432.1 [I.V.:1002.1; IV Piggyback:430] Out: 700 [Urine:700] Intake/Output this shift: No intake/output data recorded.  Physical Exam:  Not opening eyes to voice or stimulation. Not following commands. No speech. Pupils 3 mm bilaterally, round, reactive to light. Occasional spontaneous movement of extremities, left greater than right.  CBC  Recent Labs  02/01/16 1756 02/02/16 0501  WBC 13.0* 11.1*  HGB 8.9* 8.9*  HCT 27.5* 27.5*  PLT 216 226   BMET  Recent Labs  02/01/16 0536 02/02/16 0501  NA 139 142  K 3.3* 3.4*  CL 107 111  CO2 27 18*  GLUCOSE 275* 244*  BUN 15 17  CREATININE 1.09* 1.16*  CALCIUM 8.6* 8.8*    Studies/Results: Mr Brain Wo Contrast  Result Date: 01/31/2016 CLINICAL DATA:  Aphasia. Altered mental status with subarachnoid hemorrhage on CT. Recent lumbar spine surgery. Possible seizure activity in the left temporal lobe on EEG. EXAM: MRI HEAD WITHOUT CONTRAST TECHNIQUE: Multiplanar, multiecho pulse sequences of the brain and surrounding structures were obtained without intravenous contrast. COMPARISON:  Head CT 01/30/2016 FINDINGS: Multiple sequences are mildly to moderately motion degraded. Brain: There are small acute infarcts in the left cingulate gyrus and subcortical posterior left frontal lobe.  Moderate volume subarachnoid hemorrhage is again seen bilaterally involving cerebral sulci and sylvian fissures, relatively equal between the right and left sides. Blood is again seen in the occipital horns of both lateral ventricles. A chronic lacunar infarct is again seen in the right corona radiata. T2 hyperintensities elsewhere in the cerebral white matter bilaterally are nonspecific but compatible with chronic small vessel ischemic disease, mild for age. There is mild cerebral atrophy. No mass or midline shift is present. Small volume pneumocephalus is again seen. Small subdural collection over the right cerebral hemisphere measures 3 mm in thickness in the frontal region and appears to follows CSF signal intensity on all sequences within limitations of motion artifact. Dedicated thin section imaging through the temporal lobes is degraded by motion artifact, precluding detailed assessment of the mesial temporal lobe structures. Vascular: Major intracranial vascular flow voids are preserved. Skull and upper cervical spine: No focal marrow lesion. Sinuses/Orbits: Prior bilateral cataract extraction. Chronic sinusitis with extensive ethmoid and maxillary sinus opacification as described on recent CT. Moderate right and small left mastoid effusions. Other: None. IMPRESSION: 1. Moderate volume subarachnoid and intraventricular hemorrhage, similar to the recent CT. 2. Small acute infarcts in the left cingulate gyrus and posterior left frontal lobe. 3. Tiny right-sided subdural hygroma without mass effect. Pneumocephalus from recent spine surgery. 4. Mild chronic small vessel ischemic disease and cerebral atrophy. Electronically Signed   By: Logan Bores M.D.   On: 01/31/2016 12:49   Dg Chest Port 1 View  Result Date: 02/02/2016 CLINICAL  DATA:  Seizure EXAM: PORTABLE CHEST 1 VIEW COMPARISON:  01/30/2016 FINDINGS: Diffuse airspace disease and vascular congestion in a pulmonary edema pattern. No pneumothorax. Upper  normal heart size. IMPRESSION: Diffuse airspace disease and vascular congestion in a pulmonary edema pattern. Electronically Signed   By: Marybelle Killings M.D.   On: 02/02/2016 07:53    Assessment/Plan: Patient remains significantly neurologically depressed. We'll check follow-up CT the brain without contrast. Spoke with patient's daughter about her condition and plans for CT.   Hosie Spangle, MD 02/02/2016, 9:35 AM

## 2016-02-02 NOTE — Progress Notes (Signed)
D/d'ed LTM EEG.  No skin breakdown noted.

## 2016-02-02 NOTE — Progress Notes (Deleted)
Patient ID: Kimberly Baker, female   DOB: [birth year], 82 y.o.   MRN: [id number] Stable. Wants to go home. No weakness.

## 2016-02-02 NOTE — Procedures (Signed)
LTM-EEG Report  HISTORY: Continuous video-EEG monitoring performed for 82 year old with altered mental status and seizures. ACQUISITION: International 10-20 system for electrode placement; 18 channels with additional eyes linked to ipsilateral ears and EKG. Additional T1-T2 electrodes were used. Continuous video recording obtained.   EEG NUMBER:  MEDICATIONS:  Day 2: LEV, PHT  DAY #2: from [id number] 02/01/16 to 0730 02/02/16  BACKGROUND: An overall medium voltage continuous recording with some spontaneous variability and reactivity. There was no evidence of a posterior basic rhythm.  The waking background consisted of medium voltage semirhythmic 6-7hz  activity with some superimposed faster frequencies. Irregular 1-3hz  activity was intermixed with the waking background frequently in short bursts, maximal left frontocentral. Additionally, frequent short runs of sharply contoured generalized rhythmic delta activity (GRDA) were seen throughout the recording without evolution.tthis was unchanged compared to the previous days recording.  States changes are present with some normal sleep architecture (spindles). EPILEPTIFORM/PERIODIC ACTIVITY: none SEIZURES: none EVENTS: none EKG: no significant arrhythmia  SUMMARY: This was a moderately abnormal continuous video EEG due to loss of normal background and diffuse slow activity which was maximal over the left frontocentral regions at times.  This was indicative of a diffuse cerebral disturbance and superimposed focal cerebral disturbance over the left frontocentral reasons.  There were no epileptiform discharges or seizures.

## 2016-02-02 NOTE — Progress Notes (Signed)
Head CT shows improvement in blood products and IVH, no hydrocephalus, no stroke.  On exam, patient is brighter today, but still not following commands or speaking.  I anticipate that, with time, patient will improve and have encouraged family to continue with aggressive care.

## 2016-02-03 ENCOUNTER — Inpatient Hospital Stay (HOSPITAL_COMMUNITY): Payer: Medicare Other

## 2016-02-03 DIAGNOSIS — M7989 Other specified soft tissue disorders: Secondary | ICD-10-CM

## 2016-02-03 LAB — BLOOD GAS, ARTERIAL
Acid-base deficit: 4.7 mmol/L — ABNORMAL HIGH (ref 0.0–2.0)
Bicarbonate: 18.5 mmol/L — ABNORMAL LOW (ref 20.0–28.0)
DRAWN BY: 213381
O2 Content: 2 L/min
O2 Saturation: 98.4 %
PH ART: 7.452 — AB (ref 7.350–7.450)
PO2 ART: 113 mmHg — AB (ref 83.0–108.0)
Patient temperature: 99.8
pCO2 arterial: 27 mmHg — ABNORMAL LOW (ref 32.0–48.0)

## 2016-02-03 LAB — CBC
HCT: 30.2 % — ABNORMAL LOW (ref 36.0–46.0)
Hemoglobin: 9.6 g/dL — ABNORMAL LOW (ref 12.0–15.0)
MCH: 29.9 pg (ref 26.0–34.0)
MCHC: 31.8 g/dL (ref 30.0–36.0)
MCV: 94.1 fL (ref 78.0–100.0)
PLATELETS: 288 10*3/uL (ref 150–400)
RBC: 3.21 MIL/uL — AB (ref 3.87–5.11)
RDW: 13.9 % (ref 11.5–15.5)
WBC: 13 10*3/uL — AB (ref 4.0–10.5)

## 2016-02-03 LAB — BASIC METABOLIC PANEL
Anion gap: 13 (ref 5–15)
BUN: 25 mg/dL — ABNORMAL HIGH (ref 6–20)
CALCIUM: 8.7 mg/dL — AB (ref 8.9–10.3)
CO2: 20 mmol/L — ABNORMAL LOW (ref 22–32)
Chloride: 116 mmol/L — ABNORMAL HIGH (ref 101–111)
Creatinine, Ser: 1.26 mg/dL — ABNORMAL HIGH (ref 0.44–1.00)
GFR, EST AFRICAN AMERICAN: 45 mL/min — AB (ref >60)
GFR, EST NON AFRICAN AMERICAN: 39 mL/min — AB (ref >60)
Glucose, Bld: 263 mg/dL — ABNORMAL HIGH (ref 65–99)
Potassium: 3.2 mmol/L — ABNORMAL LOW (ref 3.5–5.1)
SODIUM: 149 mmol/L — AB (ref 135–145)

## 2016-02-03 LAB — GLUCOSE, CAPILLARY
GLUCOSE-CAPILLARY: 254 mg/dL — AB (ref 65–99)
GLUCOSE-CAPILLARY: 239 mg/dL — AB (ref 65–99)
Glucose-Capillary: 349 mg/dL — ABNORMAL HIGH (ref 65–99)
Glucose-Capillary: 415 mg/dL — ABNORMAL HIGH (ref 65–99)

## 2016-02-03 LAB — MAGNESIUM: Magnesium: 1.8 mg/dL (ref 1.7–2.4)

## 2016-02-03 LAB — PHENYTOIN LEVEL, FREE AND TOTAL
PHENYTOIN FREE: 2.5 ug/mL — AB (ref 1.0–2.0)
Phenytoin, Total: 17 ug/mL (ref 10.0–20.0)

## 2016-02-03 LAB — PHOSPHORUS: PHOSPHORUS: 1.9 mg/dL — AB (ref 2.5–4.6)

## 2016-02-03 MED ORDER — JEVITY 1.2 CAL PO LIQD
1000.0000 mL | ORAL | Status: DC
  Filled 2016-02-03 (×2): qty 1000

## 2016-02-03 MED ORDER — VANCOMYCIN HCL IN DEXTROSE 1-5 GM/200ML-% IV SOLN
1000.0000 mg | Freq: Once | INTRAVENOUS | Status: AC
  Administered 2016-02-03: 1000 mg via INTRAVENOUS
  Filled 2016-02-03: qty 200

## 2016-02-03 MED ORDER — SODIUM CHLORIDE 0.9% FLUSH
10.0000 mL | Freq: Two times a day (BID) | INTRAVENOUS | Status: DC
  Administered 2016-02-03 – 2016-02-04 (×3): 10 mL

## 2016-02-03 MED ORDER — SODIUM CHLORIDE 0.9% FLUSH: 10.0000 mL | INTRAVENOUS | Status: DC | PRN

## 2016-02-03 MED ORDER — JEVITY 1.2 CAL PO LIQD
1000.0000 mL | ORAL | Status: DC
  Administered 2016-02-03: 10 mL/h
  Administered 2016-02-04: 12:00:00
  Filled 2016-02-03 (×3): qty 1000

## 2016-02-03 MED ORDER — FUROSEMIDE 10 MG/ML IJ SOLN
40.0000 mg | Freq: Once | INTRAMUSCULAR | Status: AC
  Administered 2016-02-03: 40 mg via INTRAVENOUS
  Filled 2016-02-03: qty 4

## 2016-02-03 MED ORDER — MORPHINE SULFATE (PF) 2 MG/ML IV SOLN
1.0000 mg | Freq: Once | INTRAVENOUS | Status: AC
  Administered 2016-02-03: 1 mg via INTRAVENOUS

## 2016-02-03 MED ORDER — SODIUM CHLORIDE 0.9 % IV SOLN
30.0000 meq | Freq: Once | INTRAVENOUS | Status: AC
  Administered 2016-02-03: 30 meq via INTRAVENOUS
  Filled 2016-02-03: qty 15

## 2016-02-03 MED ORDER — NIMODIPINE 60 MG/20ML PO SOLN
60.0000 mg | ORAL | Status: DC
  Administered 2016-02-03 – 2016-02-04 (×4): 60 mg
  Filled 2016-02-03 (×6): qty 20

## 2016-02-03 MED ORDER — FREE WATER
100.0000 mL | Freq: Four times a day (QID) | Status: DC
  Administered 2016-02-03 – 2016-02-04 (×4): 100 mL

## 2016-02-03 MED ORDER — VANCOMYCIN HCL IN DEXTROSE 750-5 MG/150ML-% IV SOLN
750.0000 mg | INTRAVENOUS | Status: DC
  Filled 2016-02-03: qty 150

## 2016-02-03 MED ORDER — MORPHINE SULFATE (PF) 2 MG/ML IV SOLN
INTRAVENOUS | Status: AC
  Administered 2016-02-03: 1 mg via INTRAVENOUS
  Filled 2016-02-03: qty 1

## 2016-02-03 NOTE — Progress Notes (Signed)
Pt NTS per RN request due to congested cough and difficulty sucitoning pt via her mouth.  Moderate thick tan secretions obtained. Pt tolerated well.  BBS sound clearer.  RT will continue to monitor.

## 2016-02-03 NOTE — Progress Notes (Signed)
Peripherally Inserted Central Catheter/Midline Placement  The IV Nurse has discussed with the patient and/or persons authorized to consent for the patient, the purpose of this procedure and the potential benefits and risks involved with this procedure.  The benefits include less needle sticks, lab draws from the catheter, and the patient may be discharged home with the catheter. Risks include, but not limited to, infection, bleeding, blood clot (thrombus formation), and puncture of an artery; nerve damage and irregular heartbeat and possibility to perform a PICC exchange if needed/ordered by physician.  Alternatives to this procedure were also discussed.  Bard Power PICC patient education guide, fact sheet on infection prevention and patient information card has been provided to patient /or left at bedside.    PICC/Midline Placement Documentation    Consent signed by Daughter at bedside, pt is unresponsive   Kimberly Baker 02/03/2016, 2:40 PM

## 2016-02-03 NOTE — Progress Notes (Signed)
Subjective: Patient reports still not awake.  Objective: Vital signs in last 24 hours: Temp:  [97.3 F (36.3 C)-100 F (37.8 C)] 97.3 F (36.3 C) (12/27 0753) Pulse Rate:  [88-104] 101 (12/27 0753) Resp:  [30-48] 48 (12/27 0753) BP: (133-174)/(31-71) 164/60 (12/27 0753) SpO2:  [98 %-100 %] 100 % (12/27 0753)  Intake/Output from previous day: 12/26 0730 - 12/27 0729 In: 2465 [I.V.:1725; IV Piggyback:740] Out: 1350 [Urine:1350] Intake/Output this shift: Total I/O In: -  Out: 220 [Urine:220]  Physical Exam: Central hyperventilation.  Less obtunded today, withdraws all extremities to pain and moving spontaneously, left more volitional than right.  No eye opening or speech.  Dressing CDI.  Lab Results:  Recent Labs  02/02/16 0501 02/03/16 0518  WBC 11.1* 13.0*  HGB 8.9* 9.6*  HCT 27.5* 30.2*  PLT 226 288   BMET  Recent Labs  02/02/16 0501 02/03/16 0518  NA 142 149*  K 3.4* 3.2*  CL 111 116*  CO2 18* 20*  GLUCOSE 244* 263*  BUN 17 25*  CREATININE 1.16* 1.26*  CALCIUM 8.8* 8.7*    Studies/Results: Ct Head Wo Contrast  Result Date: 02/02/2016 CLINICAL DATA:  Lumbar fusion 01/15/2016. Subarachnoid hemorrhage. Altered mental status EXAM: CT HEAD WITHOUT CONTRAST TECHNIQUE: Contiguous axial images were obtained from the base of the skull through the vertex without intravenous contrast. COMPARISON:  MRI 01/31/2016, CT 01/30/2016 FINDINGS: Brain: Subarachnoid hemorrhage shows partial clearing. Intraventricular hemorrhage unchanged. No hydrocephalus. Negative for acute ischemic infarction. Negative for mass lesion. Vascular: No hyperdense vessel or unexpected calcification. Skull: None negative Sinuses/Orbits: Extensive mucosal edema in the paranasal sinuses. Left mastoidectomy. Mucosal edema with air-fluid levels right mastoid sinus. Bony thickening of the maxillary sinus bilaterally. Other: None IMPRESSION: Subarachnoid hemorrhage shows mild interval improved.  Intraventricular hemorrhage unchanged. Negative for hydrocephalus. No acute ischemic infarction. Electronically Signed   By: Franchot Gallo M.D.   On: 02/02/2016 12:49   Dg Chest Port 1 View  Result Date: 02/02/2016 CLINICAL DATA:  Seizure EXAM: PORTABLE CHEST 1 VIEW COMPARISON:  01/30/2016 FINDINGS: Diffuse airspace disease and vascular congestion in a pulmonary edema pattern. No pneumothorax. Upper normal heart size. IMPRESSION: Diffuse airspace disease and vascular congestion in a pulmonary edema pattern. Electronically Signed   By: Marybelle Killings M.D.   On: 02/02/2016 07:53    Assessment/Plan: Improving level of consciousness, but still obtunded.  Will place feeding tube, start nimodipine and nutrition.  Patient is protecting her airway well.  Doppler of right upper extremity for swelling in hand and forearm.  Continue supportive care.    LOS: 5 days    Peggyann Shoals, MD 02/03/2016, 8:17 AM

## 2016-02-03 NOTE — Progress Notes (Signed)
Inpatient Diabetes Program Recommendations  AACE/ADA: New Consensus Statement on Inpatient Glycemic Control (2015)  Target Ranges:  Prepandial:   less than 140 mg/dL      Peak postprandial:   less than 180 mg/dL (1-2 hours)      Critically ill patients:  140 - 180 mg/dL  Results for Kimberly Baker, Kimberly Baker (MRN [id number]) as of 02/03/2016 09:14  Ref. Range 02/02/2016 03:07 02/02/2016 07:48 02/02/2016 15:47 02/02/2016 19:27 02/03/2016 07:51  Glucose-Capillary Latest Ref Range: 65 - 99 mg/dL 195 (H) 201 (H) 205 (H) 265 (H) 254 (H)    Review of Glycemic Control  Outpatient Diabetes medications: Lantus 12 units QHS, Humalog 4-10 units TID with meals Current orders for Inpatient glycemic control: Lantus 12 units QHS, Novolog 3-10 units Q4H PRN  Inpatient Diabetes Program Recommendations: Insulin - Basal: Please consider increasing Lantus to 15 units QHS. Correction (SSI): Please discontinue Novolog 3-10 units PRN and use Glycemic Control order set to order Novolog 0-15 units Q4H. Insulin - Meal Coverage: Once tube feedings are started, will likely need to order Novolog for tube feeding coverage Q4H (in addition to Novolog correction scale).  Thanks, Barnie Alderman, RN, MSN, CDE Diabetes Coordinator Inpatient Diabetes Program [phone] (Team Pager from 8am to 5pm)

## 2016-02-03 NOTE — Progress Notes (Signed)
Nimodipine in pyxis as capsule, unable to put down cortrak tube. Will notify pharmacy to see if there is a liquid. Pt given tylenol supp for temp of 100.4, temp down to 99.4. Pt given prn labetalol and hydralazine for b/p' s > 170's /40's. B/p down to 146/49. Consuelo Pandy RN

## 2016-02-03 NOTE — Progress Notes (Signed)
Called to bedside for Cortrak placement.  Pt tolerated well without issued.  VSS reported off to RN.  Awaiting KUB.  Warden Fillers RN CCRN

## 2016-02-03 NOTE — Progress Notes (Signed)
*  Preliminary Results* Right upper extremity venous duplex completed. Right upper extremity is negative for deep vein thrombosis. There is evidence of superficial vein thrombosis involving the right cephalic vein, as well as two superficial veins of the anterior right hand.  Preliminary results discussed with Bailey Mech, RN.  02/03/2016 4:44 PM  Maudry Mayhew, BS, RVT, RDCS, RDMS

## 2016-02-03 NOTE — Progress Notes (Signed)
Pharmacy Antibiotic Note  Kimberly Baker is a 82 y.o. female admitted on 01/17/2016 with back pain.  Pharmacy has been consulted for vancomycin dosing for Staph aureus growing in tracheal aspirate culture.  Patient's SCr is trending up.  She is febrile and her WBC is elevated at 13.   Plan: - Vanc 1gm IV x 1, then 750mg  IV Q24H - Monitor renal fxn, micro data, clinical progress, vanc trough at Css   Height: 4\' 10"  (147.3 cm) Weight: 133 lb 8 oz (60.6 kg) IBW/kg (Calculated) : 40.9  Temp (24hrs), Avg:99.2 F (37.3 C), Min:97.3 F (36.3 C), Max:100.4 F (38 C)   Recent Labs Lab 01/30/16 0446 01/31/16 2139 02/01/16 0536 02/01/16 1756 02/02/16 0501 02/03/16 0518  WBC 13.0*  --   --  13.0* 11.1* 13.0*  CREATININE 1.20* 1.02* 1.09*  --  1.16* 1.26*    Estimated Creatinine Clearance: 27.4 mL/min (by C-G formula based on SCr of 1.26 mg/dL (H)).    Allergies  Allergen Reactions  . Compazine [Prochlorperazine Edisylate] Other (See Comments)    Seizure  . Levaquin [Levofloxacin In D5w] Rash  . Tape Other (See Comments)    PAPER TAPE ONLY brusies skin    Antimicrobials this admission:  Vanc 12/27 >>  Dose adjustments this admission:  N/A  Microbiology results:  12/25 BCx x2 - 12/26 TA - Staph aureus (preliminary)   Alix Lahmann D. Mina Marble, PharmD, BCPS Pager:  [phone] 02/03/2016, 3:41 PM

## 2016-02-03 NOTE — Care Management Note (Signed)
Case Management Note  Patient Details  Name: Kimberly Baker MRN: [id number] Date of Birth: 24-Jul-1933  Subjective/Objective:                    Action/Plan: Currently pt obtunded. CM following for d/c plans once patient is medically ready.   Expected Discharge Date:                  Expected Discharge Plan:     In-House Referral:     Discharge planning Services     Post Acute Care Choice:    Choice offered to:     DME Arranged:    DME Agency:     HH Arranged:    HH Agency:     Status of Service:     If discussed at [employer], dates discussed:    Additional Comments:  Pollie Friar, RN 02/03/2016, 9:08 AM

## 2016-02-03 NOTE — Progress Notes (Signed)
PULMONARY / CRITICAL CARE MEDICINE   Name: Kimberly Baker MRN: [id number] DOB: 22-May-1933    ADMISSION DATE:  02/06/2016 CONSULTATION DATE:  02/01/16  REFERRING MD:  Dr. Vertell Limber   CHIEF COMPLAINT:  Altered Mental Status   Brief :   82 y/o F with PMH of anemia, arthritis, DM, GERD, headaches, HTN, hypothyroidism, neuropathy and chronic back pain s/p recent injection without relief.  She has a follow up MRI that was unable to be completed but did show advanced disc degeneration at L1-L2, L4-5 and L5-S1.  It also noted L4-5 foraminal impingement due to disc herniation and facet arthritis thought to be the cause of her right sided radiculopathy.  She was admitted on 12/22 for redo decompression with fusion, pedicle screw fixation & posterolateral arthrodesis.    Post procedure, she was alert and conversant per notes with good strength.  On 12/23, she reported headache & nausea.  She was on flat bedrest at that time.  She was treated with vistaril and zofran.  Daughter reports after vistaril, she went to sleep and has not been the same since.  Later in the am, NSGY was called back to assess the patient for mental status change > she was found to have a receptive aphasia, not following commands and a slight right hemiparesis.  STAT CT of the head was obtained, neurology & medicine consulted.  She was last known normal at 7pm on 12/22.  CT revealed a subarachnoid hemorrhage.  NSGY re-evaluated later in the afternoon and felt CT findings may be related to unintended durotomy.  Neurology felt the patients symptoms to be out of proportion to the amount of SAH.  STAT EEG was ordered which was consistent with a focal seizure in the left temporal lobe that resolved with ativan administration. She was loaded with fosphenytoin and keppra.  On 12/24, she was noted to be more somnolent and an MRI was obtained which demonstrated moderate volume subarachnoid and intraventricular hemorrhage similar to recent CT, small acute  infarcts in the left cingulate gyrus/posterior left frontal lobe, tiny right subdural hygroma without mass effect.  Blood pressures were increased overnight to 190's.    PCCM called for assistance with blood pressure management.   SUBJECTIVE:  Remains on nasal cannula. CT of head yesterday shows improvement. Patient remains unresponsive. Decreased in secretions noted from yesterday   VITAL SIGNS: BP (!) 159/54 (BP Location: Left Arm)   Pulse (!) 104   Temp 97.3 F (36.3 C) (Oral)   Resp (!) 35   Ht 4\' 10"  (1.473 m)   Wt 60.6 kg (133 lb 8 oz)   SpO2 100%   BMI 27.90 kg/m   HEMODYNAMICS:    VENTILATOR SETTINGS:    INTAKE / OUTPUT: I/O last 3 completed shifts: In: 3697.1 [I.V.:2527.1; IV Piggyback:1170] Out: [id number] [Urine:1550]  PHYSICAL EXAMINATION: General:  Elderly female, lying in bed  Neuro:  No response to verbal stimuli, withdraws left upper and bilateral lower extremities, grimaces to painful stimuli  HEENT:  MM pink/dry Cardiovascular:  s1s2 rrr, no m/r/g  Lungs:  Tachypnea, non-labored, lungs bilaterally clear  Abdomen:  Soft, non-distended, bsx4 active  Musculoskeletal:  No acute deformities  Skin:  Warm/dry, no edema   LABS:  BMET  Recent Labs Lab 02/01/16 0536 02/02/16 0501 02/03/16 0518  NA 139 142 149*  K 3.3* 3.4* 3.2*  CL 107 111 116*  CO2 27 18* 20*  BUN 15 17 25*  CREATININE 1.09* 1.16* 1.26*  GLUCOSE 275* 244* 263*  Electrolytes  Recent Labs Lab 02/01/16 0536 02/02/16 0501 02/03/16 0518  CALCIUM 8.6* 8.8* 8.7*  MG  --  1.7 1.8  PHOS  --  <1.0* 1.9*    CBC  Recent Labs Lab 02/01/16 1756 02/02/16 0501 02/03/16 0518  WBC 13.0* 11.1* 13.0*  HGB 8.9* 8.9* 9.6*  HCT 27.5* 27.5* 30.2*  PLT 216 226 288    Coag's No results for input(s): APTT, INR in the last 168 hours.  Sepsis Markers No results for input(s): LATICACIDVEN, PROCALCITON, O2SATVEN in the last 168 hours.  ABG No results for input(s): PHART, PCO2ART, PO2ART  in the last 168 hours.  Liver Enzymes  Recent Labs Lab 01/30/16 0446  AST 77*  ALT 37  ALKPHOS 42  BILITOT 1.0  ALBUMIN 3.1*    Cardiac Enzymes No results for input(s): TROPONINI, PROBNP in the last 168 hours.  Glucose  Recent Labs Lab 02/01/16 2254 02/02/16 0307 02/02/16 0748 02/02/16 1547 02/02/16 1927 02/03/16 0751  GLUCAP 117* 195* 201* 205* 265* 254*    Imaging Ct Head Wo Contrast  Result Date: 02/02/2016 CLINICAL DATA:  Lumbar fusion 01/20/2016. Subarachnoid hemorrhage. Altered mental status EXAM: CT HEAD WITHOUT CONTRAST TECHNIQUE: Contiguous axial images were obtained from the base of the skull through the vertex without intravenous contrast. COMPARISON:  MRI 01/31/2016, CT 01/30/2016 FINDINGS: Brain: Subarachnoid hemorrhage shows partial clearing. Intraventricular hemorrhage unchanged. No hydrocephalus. Negative for acute ischemic infarction. Negative for mass lesion. Vascular: No hyperdense vessel or unexpected calcification. Skull: None negative Sinuses/Orbits: Extensive mucosal edema in the paranasal sinuses. Left mastoidectomy. Mucosal edema with air-fluid levels right mastoid sinus. Bony thickening of the maxillary sinus bilaterally. Other: None IMPRESSION: Subarachnoid hemorrhage shows mild interval improved. Intraventricular hemorrhage unchanged. Negative for hydrocephalus. No acute ischemic infarction. Electronically Signed   By: Franchot Gallo M.D.   On: 02/02/2016 12:49     STUDIES:  12/23 CT Head >> small to mod subarachnoid hemorrhage.   12/23 EEG >> consistent with a focal seizure in the L temporal lobe that resolved with ativan administration.  12/24 MRI >> moderate volume subarachnoid and intraventricular hemorrhage similar to recent CT, small acute infarcts in the left cingulate gyrus/posterior left frontal lobe, tiny right subdural hygroma without mass effect. 12/26 EEG >  abnormal continuous video EEG due to loss of normal background and diffuse  slow activity, no epileptiform discharges   12/26 CT Head > SAH mild improvement, IVH unchanged  CULTURES: Blood 12/25 >  Sputum 12/26 >  ANTIBIOTICS:   SIGNIFICANT EVENTS: 12/22  Admit for lumbar decompression with fusion, pedicle screw fixation & posterolateral arthrodesis.   12/23  AMS with CT c/w SAH, EEG + for focal seizures 12/25  PCCM consulted for medical management   LINES/TUBES:   DISCUSSION: 82 y/o F admitted for lumbar decompression with fusion.  Post-operatively developed AMS and was found to have a SAH and focal seizures.    ASSESSMENT / PLAN:  NEUROLOGIC A:   Acute Encephalopathy - likely multifactorial in the setting of SAH, seizures, delirium, + sedating medications Small to Moderate SAH  Seizures  P:   RASS goal: 0 Minimize sedating medications as able  Defer work up to Neurology / NSGY AED's per Neurology  Seizure precautions   PULMONARY A: At Risk Aspiration / Respiratory Failure - in the setting of neurologic dysfunction  Acute Hypoxic Respiratory Failure  P:   Aspiration precautions  Trend CXR O2 as needed to support saturations > 92% ABG now - may need intubation for airway  protection  Sputum culture pending   CARDIOVASCULAR A:  Hypertension > improved 12/26 P:  Hold home norvasc, coreg, lasix Lopressor 5mg  IV Q6  PRN labetalol  SBP goal < 160   RENAL A:   Hypokalemia Hypernatremia  Hx CKD  P:   Hold home lasix  Lasix 40 mg x 1  Trend BMP / UOP  Replace electrolytes as indicated  D/C NS @ 75  Start free water @ 100 q6h once cortrack in place   GASTROINTESTINAL A:   Hx GERD P:   NPO  Continue PPI   Cortrack Pending  Nutrition following   HEMATOLOGIC A:   Anemia  P:  Trend CBC  SCD's  No heparin with SAH  INFECTIOUS A:   At Risk Aspiration P:   Monitor fever curve / WBC Follow Culture data  ENDOCRINE A:   Hypothyroidism  DM  P:   Continue synthroid SSI Glucose Checks Q4H  FAMILY  - Updates:  Daughters updated at bedside 12/26.  Granddaughter-in-law is a Immunologist at [employer]).    - Inter-disciplinary family meet or Palliative Care meeting due by:  1/1  Hayden Pedro, AG-ACNP [facility]  Pgr: [phone]  PCCM Pgr: [phone]

## 2016-02-03 NOTE — Progress Notes (Signed)
eLink Physician-Brief Progress Note Patient Name: Kimberly Baker DOB: [birth year] MRN: [id number]   Date of Service  02/03/2016  HPI/Events of Note  Gm + Cocci in clusters on tracheal aspirate Gm stain and Culture.   eICU Interventions  Will order Vancomycin per pharmacy consult.      Intervention Category Major Interventions: Infection - evaluation and management  Sommer,Steven Eugene 02/03/2016, 3:22 PM

## 2016-02-03 NOTE — Plan of Care (Signed)
  Interdisciplinary Goals of Care Family Meeting   Date carried out:: 02/03/2016  Location of the meeting: Unit  Member's involved: Physician, Bedside Registered Nurse and Family Member or next of kin  Durable Power of Attorney or acting medical decision maker: Husband/Kimberly Baker    Discussion: We discussed goals of care for [name] .  Spoke with the patient's daughter regarding her current neurological state. We discussed her tenuous respiratory status and my concern that eventually she may fatigue and require endotracheal intubation for respiratory support. At that point given her poor neurologic status I feel she would be in need of a tracheostomy and PEG tube placement pending a rapid neurologic recovery. The patient's daughter does not feel that she would wish to be placed in a nursing home and likely would not want tracheostomy and PEG tube placement. She is going to speak with other family members and the patient's husband regarding her current clinical state and wishes. I have also spoken with neurology who will come and updated the patient's daughter at bedside. For now we will continue her current plan of care and await their decision regarding CODE STATUS.  Code status: Full Code  Disposition: Continue current acute care  Time spent for the meeting: 12 minutes   Kimberly Baker 02/03/2016, 12:53 PM

## 2016-02-03 NOTE — Progress Notes (Signed)
Spoke with RNs about patient needing a second set of eyes to assess patient, arrived as soon as I could at 925.  Patient has been experiencing tachypnea since yesterday.  Per nursing RR has stayed in the mid 76s.  On arrival PCCM NP was at the bedside. Patient was breathing in the mids 40 per my assessment, shallow breathing, + air movement, some crackles.    Given the patient's current state (+SAH, +IVH), I asked if we could try some morphine to see if the labored breathing would ease, perhaps this is pain related.  Morphine 1mg  was given and did not change the status of the breathing, other VS okay, hypertensive at times, sats 100%.  This breathing might be related to her neurologic state, airway protection is okay for now but patient might need to intubated.  + weak cough/gag, lasix was ordered as well prior to my arrival.  I had no other interventions.  CCM NP and MD will talk to family about plan of care for patient.   I spoke with CCM NP in the mid-afternoon, no change, will monitor as needed.

## 2016-02-03 NOTE — Progress Notes (Signed)
Subjective: Less responsive to stimuli. Increased respiratory rate has transitioned from Cheyne-Stokes to a central neurogenic hyperventilation pattern.   Objective: Current vital signs: BP (!) 184/72 (BP Location: Left Arm)   Pulse 88   Temp (!) 100.4 F (38 C) (Axillary)   Resp (!) 25   Ht '4\' 10"'$  (1.473 m)   Wt 60.6 kg (133 lb 8 oz)   SpO2 100%   BMI 27.90 kg/m  Vital signs in last 24 hours: Temp:  [97.3 F (36.3 C)-100.4 F (38 C)] 100.4 F (38 C) (12/27 1211) Pulse Rate:  [88-106] 88 (12/27 1211) Resp:  [25-48] 25 (12/27 1211) BP: (133-189)/(31-72) 184/72 (12/27 1211) SpO2:  [98 %-100 %] 100 % (12/27 1211)  Intake/Output from previous day: 12/26 0701 - 12/27 0700 In: 2465 [I.V.:1725; IV Piggyback:740] Out: 1350 [Urine:1350] Intake/Output this shift: Total I/O In: 150 [I.V.:150] Out: 220 [Urine:220] Nutritional status: Diet NPO time specified  General Exam: Rapid respirations up to 40 breaths/min. Most consistent with central neurogenic hyperventilation.  RUE edema.   Neurologic Exam: Ment: Eyes closed, do not open to voice or with tactile stimulation. No spontaneous movement. Does not attempt to communicate. Slightly decreased responsiveness to stimuli compared to yesterday.  CN: PERRL. Does not fixate on visual stimuli. No blink to threat. Reacts slightly to noxious stimuli.  Motor/Sensory: Semipurposeful upper extremity movements to noxious stimuli. Weakly responds to noxious stimuli applied to lower extremities.   Lab Results: Results for orders placed or performed during the hospital encounter of 01/15/2016 (from the past 48 hour(s))  Glucose, capillary     Status: Abnormal   Collection Time: 02/01/16  4:14 PM  Result Value Ref Range   Glucose-Capillary 201 (H) 65 - 99 mg/dL  CBC with Differential/Platelet     Status: Abnormal   Collection Time: 02/01/16  5:56 PM  Result Value Ref Range   WBC 13.0 (H) 4.0 - 10.5 K/uL   RBC 2.97 (L) 3.87 - 5.11 MIL/uL    Hemoglobin 8.9 (L) 12.0 - 15.0 g/dL   HCT 27.5 (L) 36.0 - 46.0 %   MCV 92.6 78.0 - 100.0 fL   MCH 30.0 26.0 - 34.0 pg   MCHC 32.4 30.0 - 36.0 g/dL   RDW 13.0 11.5 - 15.5 %   Platelets 216 150 - 400 K/uL   Neutrophils Relative % 85 %   Neutro Abs 11.1 (H) 1.7 - 7.7 K/uL   Lymphocytes Relative 8 %   Lymphs Abs 1.0 0.7 - 4.0 K/uL   Monocytes Relative 7 %   Monocytes Absolute 1.0 0.1 - 1.0 K/uL   Eosinophils Relative 0 %   Eosinophils Absolute 0.0 0.0 - 0.7 K/uL   Basophils Relative 0 %   Basophils Absolute 0.0 0.0 - 0.1 K/uL  Glucose, capillary     Status: Abnormal   Collection Time: 02/01/16  7:59 PM  Result Value Ref Range   Glucose-Capillary 170 (H) 65 - 99 mg/dL   Comment 1 Notify RN    Comment 2 Document in Chart   Glucose, capillary     Status: Abnormal   Collection Time: 02/01/16 10:54 PM  Result Value Ref Range   Glucose-Capillary 117 (H) 65 - 99 mg/dL  Glucose, capillary     Status: Abnormal   Collection Time: 02/02/16  3:07 AM  Result Value Ref Range   Glucose-Capillary 195 (H) 65 - 99 mg/dL  Phenytoin level, total     Status: None   Collection Time: 02/02/16  5:01 AM  Result  Value Ref Range   Phenytoin Lvl 19.5 10.0 - 20.0 ug/mL  Basic metabolic panel     Status: Abnormal   Collection Time: 02/02/16  5:01 AM  Result Value Ref Range   Sodium 142 135 - 145 mmol/L   Potassium 3.4 (L) 3.5 - 5.1 mmol/L   Chloride 111 101 - 111 mmol/L   CO2 18 (L) 22 - 32 mmol/L   Glucose, Bld 244 (H) 65 - 99 mg/dL   BUN 17 6 - 20 mg/dL   Creatinine, Ser 1.16 (H) 0.44 - 1.00 mg/dL   Calcium 8.8 (L) 8.9 - 10.3 mg/dL   GFR calc non Af Amer 43 (L) >60 mL/min   GFR calc Af Amer 50 (L) >60 mL/min    Comment: (NOTE) The eGFR has been calculated using the CKD EPI equation. This calculation has not been validated in all clinical situations. eGFR's persistently <60 mL/min signify possible Chronic Kidney Disease.    Anion gap 13 5 - 15  CBC     Status: Abnormal   Collection Time:  02/02/16  5:01 AM  Result Value Ref Range   WBC 11.1 (H) 4.0 - 10.5 K/uL   RBC 2.94 (L) 3.87 - 5.11 MIL/uL   Hemoglobin 8.9 (L) 12.0 - 15.0 g/dL   HCT 27.5 (L) 36.0 - 46.0 %   MCV 93.5 78.0 - 100.0 fL   MCH 30.3 26.0 - 34.0 pg   MCHC 32.4 30.0 - 36.0 g/dL   RDW 13.3 11.5 - 15.5 %   Platelets 226 150 - 400 K/uL  Magnesium     Status: None   Collection Time: 02/02/16  5:01 AM  Result Value Ref Range   Magnesium 1.7 1.7 - 2.4 mg/dL  Phosphorus     Status: Abnormal   Collection Time: 02/02/16  5:01 AM  Result Value Ref Range   Phosphorus <1.0 (LL) 2.5 - 4.6 mg/dL    Comment: REPEATED TO VERIFY CRITICAL RESULT CALLED TO, READ BACK BY AND VERIFIED WITH: IRBY T,RN 02/02/16 0611 WAYK   Glucose, capillary     Status: Abnormal   Collection Time: 02/02/16  7:48 AM  Result Value Ref Range   Glucose-Capillary 201 (H) 65 - 99 mg/dL   Comment 1 Notify RN    Comment 2 Document in Chart   Culture, respiratory (NON-Expectorated)     Status: None (Preliminary result)   Collection Time: 02/02/16  1:20 PM  Result Value Ref Range   Specimen Description TRACHEAL ASPIRATE    Special Requests NONE    Gram Stain      ABUNDANT WBC PRESENT, PREDOMINANTLY PMN ABUNDANT GRAM POSITIVE COCCI IN PAIRS IN CLUSTERS FEW GRAM NEGATIVE RODS RARE SQUAMOUS EPITHELIAL CELLS PRESENT    Culture      ABUNDANT STAPHYLOCOCCUS AUREUS CULTURE REINCUBATED FOR BETTER GROWTH    Report Status PENDING   Glucose, capillary     Status: Abnormal   Collection Time: 02/02/16  3:47 PM  Result Value Ref Range   Glucose-Capillary 205 (H) 65 - 99 mg/dL   Comment 1 Notify RN   Glucose, capillary     Status: Abnormal   Collection Time: 02/02/16  7:27 PM  Result Value Ref Range   Glucose-Capillary 265 (H) 65 - 99 mg/dL  Basic metabolic panel     Status: Abnormal   Collection Time: 02/03/16  5:18 AM  Result Value Ref Range   Sodium 149 (H) 135 - 145 mmol/L   Potassium 3.2 (L) 3.5 - 5.1 mmol/L  Chloride 116 (H) 101 - 111  mmol/L   CO2 20 (L) 22 - 32 mmol/L   Glucose, Bld 263 (H) 65 - 99 mg/dL   BUN 25 (H) 6 - 20 mg/dL   Creatinine, Ser 1.26 (H) 0.44 - 1.00 mg/dL   Calcium 8.7 (L) 8.9 - 10.3 mg/dL   GFR calc non Af Amer 39 (L) >60 mL/min   GFR calc Af Amer 45 (L) >60 mL/min    Comment: (NOTE) The eGFR has been calculated using the CKD EPI equation. This calculation has not been validated in all clinical situations. eGFR's persistently <60 mL/min signify possible Chronic Kidney Disease.    Anion gap 13 5 - 15  CBC     Status: Abnormal   Collection Time: 02/03/16  5:18 AM  Result Value Ref Range   WBC 13.0 (H) 4.0 - 10.5 K/uL   RBC 3.21 (L) 3.87 - 5.11 MIL/uL   Hemoglobin 9.6 (L) 12.0 - 15.0 g/dL   HCT 30.2 (L) 36.0 - 46.0 %   MCV 94.1 78.0 - 100.0 fL   MCH 29.9 26.0 - 34.0 pg   MCHC 31.8 30.0 - 36.0 g/dL   RDW 13.9 11.5 - 15.5 %   Platelets 288 150 - 400 K/uL  Phosphorus     Status: Abnormal   Collection Time: 02/03/16  5:18 AM  Result Value Ref Range   Phosphorus 1.9 (L) 2.5 - 4.6 mg/dL  Magnesium     Status: None   Collection Time: 02/03/16  5:18 AM  Result Value Ref Range   Magnesium 1.8 1.7 - 2.4 mg/dL  Glucose, capillary     Status: Abnormal   Collection Time: 02/03/16  7:51 AM  Result Value Ref Range   Glucose-Capillary 254 (H) 65 - 99 mg/dL   Comment 1 Notify RN    Comment 2 Document in Chart   Blood gas, arterial     Status: Abnormal   Collection Time: 02/03/16 11:00 AM  Result Value Ref Range   O2 Content 2.0 L/min   Delivery systems NASAL CANNULA    pH, Arterial 7.452 (H) 7.350 - 7.450   pCO2 arterial 27.0 (L) 32.0 - 48.0 mmHg   pO2, Arterial 113 (H) 83.0 - 108.0 mmHg   Bicarbonate 18.5 (L) 20.0 - 28.0 mmol/L   Acid-base deficit 4.7 (H) 0.0 - 2.0 mmol/L   O2 Saturation 98.4 %   Patient temperature 99.8    Collection site LEFT RADIAL    Drawn by [id number]    Sample type ARTERIAL DRAW    Allens test (pass/fail) PASS PASS  Glucose, capillary     Status: Abnormal   Collection  Time: 02/03/16 12:31 PM  Result Value Ref Range   Glucose-Capillary 239 (H) 65 - 99 mg/dL   Comment 1 Notify RN    Comment 2 Document in Chart     Recent Results (from the past 240 hour(s))  Culture, respiratory (NON-Expectorated)     Status: None (Preliminary result)   Collection Time: 02/02/16  1:20 PM  Result Value Ref Range Status   Specimen Description TRACHEAL ASPIRATE  Final   Special Requests NONE  Final   Gram Stain   Final    ABUNDANT WBC PRESENT, PREDOMINANTLY PMN ABUNDANT GRAM POSITIVE COCCI IN PAIRS IN CLUSTERS FEW GRAM NEGATIVE RODS RARE SQUAMOUS EPITHELIAL CELLS PRESENT    Culture   Final    ABUNDANT STAPHYLOCOCCUS AUREUS CULTURE REINCUBATED FOR BETTER GROWTH    Report Status PENDING  Incomplete  Lipid Panel No results for input(s): CHOL, TRIG, HDL, CHOLHDL, VLDL, LDLCALC in the last 72 hours.  Studies/Results: Ct Head Wo Contrast  Result Date: 02/02/2016 CLINICAL DATA:  Lumbar fusion 01/28/2016. Subarachnoid hemorrhage. Altered mental status EXAM: CT HEAD WITHOUT CONTRAST TECHNIQUE: Contiguous axial images were obtained from the base of the skull through the vertex without intravenous contrast. COMPARISON:  MRI 01/31/2016, CT 01/30/2016 FINDINGS: Brain: Subarachnoid hemorrhage shows partial clearing. Intraventricular hemorrhage unchanged. No hydrocephalus. Negative for acute ischemic infarction. Negative for mass lesion. Vascular: No hyperdense vessel or unexpected calcification. Skull: None negative Sinuses/Orbits: Extensive mucosal edema in the paranasal sinuses. Left mastoidectomy. Mucosal edema with air-fluid levels right mastoid sinus. Bony thickening of the maxillary sinus bilaterally. Other: None IMPRESSION: Subarachnoid hemorrhage shows mild interval improved. Intraventricular hemorrhage unchanged. Negative for hydrocephalus. No acute ischemic infarction. Electronically Signed   By: Marlan Palau M.D.   On: 02/02/2016 12:49   Dg Chest Port 1  View  Result Date: 02/02/2016 CLINICAL DATA:  Seizure EXAM: PORTABLE CHEST 1 VIEW COMPARISON:  01/30/2016 FINDINGS: Diffuse airspace disease and vascular congestion in a pulmonary edema pattern. No pneumothorax. Upper normal heart size. IMPRESSION: Diffuse airspace disease and vascular congestion in a pulmonary edema pattern. Electronically Signed   By: Jolaine Click M.D.   On: 02/02/2016 07:53   Dg Abd Portable 1v  Result Date: 02/03/2016 CLINICAL DATA:  NG tube placement EXAM: PORTABLE ABDOMEN - 1 VIEW COMPARISON:  None. FINDINGS: Nasogastric to: The stomach. There is no bowel dilatation to suggest obstruction. There is no evidence of pneumoperitoneum, portal venous gas or pneumatosis. There are no pathologic calcifications along the expected course of the ureters. Posterior lumbar fusion from L4 through S1. IMPRESSION: NG tube coiled in the stomach. Electronically Signed   By: Elige Ko   On: 02/03/2016 13:07    Medications:  Scheduled: . free water  100 mL Per Tube Q6H  . insulin glargine  12 Units Subcutaneous QHS  . levETIRAcetam  750 mg Intravenous Q12H  . levothyroxine  25 mcg Intravenous QAC breakfast  . metoprolol  5 mg Intravenous Q6H  . niMODipine  60 mg Oral Q4H  . pantoprazole (PROTONIX) IV  40 mg Intravenous Q12H  . phenytoin (DILANTIN) IV  75 mg Intravenous Q8H  . sodium chloride flush  3 mL Intravenous Q12H    Assessment: 82 yo F with altered mental status in the setting of subarachnoid hemorrhage following CSF leak.  1. Initially may have been having recurrent seizures, but EEG showed no electrographic seizures. A benzodiazepine challenge did not result in changes to the EEG pattern. Of note, EEG background slowing c/w moderate encephalopathy of non specific etiologies; relative left frontotemporal slowing might be suggestive of neuronal dysfunction in that region.  2. MRI brain revealed no change to previously seen blood product distribution previously seen on CT. Small  cortically based foci of restricted diffusion appeared most consistent with small cortical strokes, likely secondary to vasospasm, but could also represent cytotoxic edema secondary to persistent seizure activity initially.  3. CT head on 12/26 showed mild interval improvement of subarachnoid hemorrhage relative to prior. Intraventricular hemorrhage was unchanged. The scan was negative for hydrocephalus or acute ischemic infarction.  Recommendations: 1. Continue Keppra and Dilantin.  2. Discussed prognosis with family again today. Given her decreasing responsiveness to stimuli and Cheyne-Stokes breathing evolving to a central neurogenic hyperventilation pattern, her prognosis is now worse. Initially it was thought that with time, clearance of blood products might result in improvement,  but that there was a relatively high probability that long term neurological outcome would be poor. Family now considering DNR/DNI status as well as how aggressive therapy should be given the patient's expressed desire in the past (per daughter) that she would not want to be dependent upon advanced life sustaining measures such as mechanical ventilation.   3. Continue nimodipine as prophylaxis against vasospasm (60 mg per NGT q4h x 21 days).       LOS: 5 days   '@Electronically'$  signed: Dr. Kerney Elbe 02/03/2016  2:33 PM

## 2016-02-03 NOTE — Progress Notes (Signed)
OT Discharge Note  Patient Details Name: CHI CRISE MRN: [id number] DOB: 23-Aug-1933   Cancelled Treatment:    Reason Eval/Treat Not Completed: Patient not medically ready;Other (comment) (sign off )  Pt remains on strict bedrest at this time. Peri Maris  [phone] 02/03/2016, 7:38 AM

## 2016-02-03 NOTE — Progress Notes (Signed)
Initial Nutrition Assessment  DOCUMENTATION CODES:   Not applicable  INTERVENTION:   -Initiate Jevity 1.2 @ 20 ml/hr via cortrak tube and increase by 10 ml every 4 hours to goal rate of 60 ml/hr.   Tube feeding regimen provides 1728 kcal (100% of needs), 80 grams of protein, and 1162 ml of H2O.   NUTRITION DIAGNOSIS:   Inadequate oral intake related to inability to eat as evidenced by NPO status.  GOAL:   Patient will meet greater than or equal to 90% of their needs  MONITOR:   Labs, Weight trends, TF tolerance, Skin, I & O's  REASON FOR ASSESSMENT:   Consult Enteral/tube feeding initiation and management  ASSESSMENT:   82 y/o F admitted for lumbar decompression with fusion.  Post-operatively developed AMS and was found to have a SAH and focal seizures.    S/p Procedure(s) with comments on 02/03/2016: LUMBAR FOUR -  LUMBAR FIVE, LUMBAR FIVE - SACRAL ONE REDO DECOMPRESSION WITH FUSION (N/A) - LUMBAR FOUR -  LUMBAR FIVE, LUMBAR FIVE - SACRAL ONE REDO DECOMPRESSION WITH FUSION with pedicle screw fixation and posterolateral arthrodesis  Pt minimally responsive.  Spoke with pt daughter at bedside. PTA pt had a fair appetite- pt typically had 3 meals and one snack daily. Pt was on no diet restrictions. Pt daughter shared that pt did require some encouragement to eat and would experience hypoglycemic events, due to rapid acting insulin. Hgb A1c historically in 6-7 range.   Pt daughter denies any weight loss and confirms UBW around 133#.   Nutrition-Focused physical exam completed. Findings are no fat depletion, no muscle depletion, and moderate edema.   Pt daughter aware of plan to place cortrak tube and start TF. She is very Patent attorney of RD assistance.   ADDENDUM: cortrak tube placed; KUB verified placement of tip of tube in stomach.   Labs reviewed: Na: 149, K: 3.2, Phos: 1.9, CBGS: 239-254.   Diet Order:  Diet NPO time specified  Skin:  Reviewed, no issues  Last BM:   02/03/16  Height:   Ht Readings from Last 1 Encounters:  01/16/2016 4\' 10"  (1.473 m)    Weight:   Wt Readings from Last 1 Encounters:  01/15/2016 133 lb 8 oz (60.6 kg)    Ideal Body Weight:  43.9 kg  BMI:  Body mass index is 27.9 kg/m.  Estimated Nutritional Needs:   Kcal:  1600-1800  Protein:  80-95 grams  Fluid:  1.6-1.8 L  EDUCATION NEEDS:   Education needs addressed  Ylonda Storr A. Jimmye Norman, RD, LDN, CDE Pager: [phone] After hours Pager: [phone]

## 2016-02-04 ENCOUNTER — Inpatient Hospital Stay (HOSPITAL_COMMUNITY): Payer: Medicare Other

## 2016-02-04 DIAGNOSIS — Z7189 Other specified counseling: Secondary | ICD-10-CM

## 2016-02-04 LAB — GLUCOSE, CAPILLARY
GLUCOSE-CAPILLARY: 242 mg/dL — AB (ref 65–99)
GLUCOSE-CAPILLARY: 47 mg/dL — AB (ref 65–99)
Glucose-Capillary: 56 mg/dL — ABNORMAL LOW (ref 65–99)

## 2016-02-04 LAB — BLOOD GAS, ARTERIAL
Acid-base deficit: 3.1 mmol/L — ABNORMAL HIGH (ref 0.0–2.0)
BICARBONATE: 19.6 mmol/L — AB (ref 20.0–28.0)
Drawn by: 362771
FIO2: 30
O2 Content: 2 L/min
O2 Saturation: 95.7 %
PCO2 ART: 25.2 mmHg — AB (ref 32.0–48.0)
PH ART: 7.503 — AB (ref 7.350–7.450)
PO2 ART: 73.1 mmHg — AB (ref 83.0–108.0)
Patient temperature: 98.6

## 2016-02-04 LAB — POCT I-STAT 3, ART BLOOD GAS (G3+)
ACID-BASE DEFICIT: 9 mmol/L — AB (ref 0.0–2.0)
Acid-base deficit: 6 mmol/L — ABNORMAL HIGH (ref 0.0–2.0)
BICARBONATE: 21.4 mmol/L (ref 20.0–28.0)
BICARBONATE: 20 mmol/L (ref 20.0–28.0)
O2 SAT: 100 %
O2 Saturation: 94 %
PCO2 ART: 73.8 mmHg — AB (ref 32.0–48.0)
PH ART: 7.274 — AB (ref 7.350–7.450)
PO2 ART: 271 mmHg — AB (ref 83.0–108.0)
Patient temperature: 102.2
TCO2: 23 mmol/L (ref 0–100)
TCO2: 21 mmol/L (ref 0–100)
pCO2 arterial: 43.5 mmHg (ref 32.0–48.0)
pH, Arterial: 7.082 — CL (ref 7.350–7.450)
pO2, Arterial: 82 mmHg — ABNORMAL LOW (ref 83.0–108.0)

## 2016-02-04 LAB — TROPONIN I: Troponin I: 0.18 ng/mL (ref <0.03)

## 2016-02-04 LAB — PROCALCITONIN: PROCALCITONIN: 5.34 ng/mL

## 2016-02-04 LAB — TRIGLYCERIDES: Triglycerides: 89 mg/dL (ref <150)

## 2016-02-04 MED ORDER — SODIUM CHLORIDE 0.9 % IV SOLN
10.0000 mg/h | INTRAVENOUS | Status: DC
  Administered 2016-02-04: 10 mg/h via INTRAVENOUS
  Filled 2016-02-04 (×2): qty 5
  Filled 2016-02-04: qty 10

## 2016-02-04 MED ORDER — MORPHINE SULFATE (PF) 2 MG/ML IV SOLN
INTRAVENOUS | Status: AC
  Filled 2016-02-04: qty 1

## 2016-02-04 MED ORDER — FENTANYL CITRATE (PF) 100 MCG/2ML IJ SOLN: 50.0000 ug | INTRAMUSCULAR | Status: DC | PRN

## 2016-02-04 MED ORDER — NOREPINEPHRINE BITARTRATE 1 MG/ML IV SOLN
0.0000 ug/min | INTRAVENOUS | Status: DC
  Administered 2016-02-04: 2 ug/min via INTRAVENOUS
  Administered 2016-02-04: 40 ug/min via INTRAVENOUS
  Administered 2016-02-04: 39 ug/min via INTRAVENOUS
  Filled 2016-02-04 (×3): qty 4

## 2016-02-04 MED ORDER — PROPOFOL 1000 MG/100ML IV EMUL
INTRAVENOUS | Status: AC
  Filled 2016-02-04: qty 100

## 2016-02-04 MED ORDER — ROCURONIUM BROMIDE 50 MG/5ML IV SOLN
60.0000 mg | Freq: Once | INTRAVENOUS | Status: AC
  Administered 2016-02-04: 60 mg via INTRAVENOUS
  Filled 2016-02-04: qty 6

## 2016-02-04 MED ORDER — MIDAZOLAM HCL 2 MG/2ML IJ SOLN: 1.0000 mg | INTRAMUSCULAR | Status: DC | PRN

## 2016-02-04 MED ORDER — ORAL CARE MOUTH RINSE
15.0000 mL | OROMUCOSAL | Status: DC
  Administered 2016-02-04 (×2): 15 mL via OROMUCOSAL

## 2016-02-04 MED ORDER — CHLORHEXIDINE GLUCONATE 0.12% ORAL RINSE (MEDLINE KIT)
15.0000 mL | Freq: Two times a day (BID) | OROMUCOSAL | Status: DC
  Administered 2016-02-04: 15 mL via OROMUCOSAL

## 2016-02-04 MED ORDER — MORPHINE BOLUS VIA INFUSION
5.0000 mg | INTRAVENOUS | Status: DC | PRN
  Administered 2016-02-04: 5 mg via INTRAVENOUS
  Filled 2016-02-04: qty 20

## 2016-02-04 MED ORDER — ETOMIDATE 2 MG/ML IV SOLN
20.0000 mg | Freq: Once | INTRAVENOUS | Status: AC
Start: 2016-02-04 — End: 2016-02-04
  Administered 2016-02-04: 20 mg via INTRAVENOUS

## 2016-02-04 MED ORDER — PROPOFOL 1000 MG/100ML IV EMUL
0.0000 ug/kg/min | INTRAVENOUS | Status: DC
  Administered 2016-02-04: 5 ug/kg/min via INTRAVENOUS

## 2016-02-04 MED ORDER — MIDAZOLAM HCL 2 MG/2ML IJ SOLN
2.0000 mg | Freq: Once | INTRAMUSCULAR | Status: AC
  Administered 2016-02-04: 2 mg via INTRAVENOUS

## 2016-02-04 MED ORDER — MIDAZOLAM HCL 2 MG/2ML IJ SOLN
INTRAMUSCULAR | Status: AC
  Filled 2016-02-04: qty 2

## 2016-02-04 MED ORDER — SODIUM CHLORIDE 0.9 % IV BOLUS (SEPSIS): 500.0000 mL | Freq: Once | INTRAVENOUS | Status: AC

## 2016-02-04 MED ORDER — MAGNESIUM SULFATE 2 GM/50ML IV SOLN
2.0000 g | Freq: Once | INTRAVENOUS | Status: AC
  Administered 2016-02-04: 2 g via INTRAVENOUS
  Filled 2016-02-04: qty 50

## 2016-02-04 MED ORDER — PIPERACILLIN-TAZOBACTAM 3.375 G IVPB
3.3750 g | Freq: Three times a day (TID) | INTRAVENOUS | Status: DC
Start: 2016-02-04 — End: 2016-02-04
  Administered 2016-02-04: 3.375 g via INTRAVENOUS
  Filled 2016-02-04 (×2): qty 50

## 2016-02-04 MED ORDER — SODIUM CHLORIDE 0.9 % IV SOLN
30.0000 meq | Freq: Once | INTRAVENOUS | Status: AC
  Administered 2016-02-04: 30 meq via INTRAVENOUS
  Filled 2016-02-04: qty 15

## 2016-02-04 MED ORDER — MORPHINE SULFATE (PF) 2 MG/ML IV SOLN
1.0000 mg | INTRAVENOUS | Status: DC | PRN
  Administered 2016-02-04 (×2): 2 mg via INTRAVENOUS
  Filled 2016-02-04: qty 1

## 2016-02-04 MED ORDER — FENTANYL CITRATE (PF) 100 MCG/2ML IJ SOLN
INTRAMUSCULAR | Status: AC
  Filled 2016-02-04: qty 2

## 2016-02-04 MED ORDER — DEXTROSE 50 % IV SOLN: 1.0000 | Freq: Once | INTRAVENOUS | Status: AC

## 2016-02-04 MED ORDER — FENTANYL CITRATE (PF) 100 MCG/2ML IJ SOLN
100.0000 ug | Freq: Once | INTRAMUSCULAR | Status: AC
  Administered 2016-02-04: 100 ug via INTRAVENOUS

## 2016-02-04 MED ORDER — DEXTROSE 50 % IV SOLN
INTRAVENOUS | Status: AC
  Administered 2016-02-04: 50 mL
  Filled 2016-02-04: qty 50

## 2016-02-04 MED ORDER — VASOPRESSIN 20 UNIT/ML IV SOLN
0.0300 [IU]/min | INTRAVENOUS | Status: DC
  Administered 2016-02-04: 0.03 [IU]/min via INTRAVENOUS
  Filled 2016-02-04: qty 2

## 2016-02-04 MED ORDER — SODIUM CHLORIDE 0.9 % IV BOLUS (SEPSIS)
500.0000 mL | Freq: Once | INTRAVENOUS | Status: AC
  Administered 2016-02-04: 500 mL via INTRAVENOUS

## 2016-02-06 LAB — CULTURE, RESPIRATORY

## 2016-02-06 LAB — CULTURE, BLOOD (ROUTINE X 2)
Culture: NO GROWTH
Culture: NO GROWTH

## 2016-02-06 LAB — CULTURE, RESPIRATORY W GRAM STAIN

## 2016-02-08 NOTE — Procedures (Signed)
Intubation Procedure Note Kimberly Baker [id number] [birth year]  Procedure: Intubation Indications: Respiratory insufficiency  Procedure Details Consent: Obtained Time Out: Verified patient identification, verified procedure, site/side was marked, verified correct patient position, special equipment/implants available, medications/allergies/relevent history reviewed, required imaging and test results available.  Performed  Kimberly Baker and 3   Evaluation Hemodynamic Status: BP stable throughout; O2 sats: stable throughout Patient's Current Condition: stable Complications: No apparent complications Patient did tolerate procedure well. Chest X-ray ordered to verify placement.  CXR: pending.   Kimberly Baker 02/28/2016

## 2016-02-08 NOTE — Procedures (Signed)
Arterial Catheter Insertion Procedure Note Kimberly Baker [id number] 12-08-1933  Procedure: Insertion of Arterial Catheter  Indications: Blood pressure monitoring and Frequent blood sampling  Procedure Details Consent: Risks of procedure as well as the alternatives and risks of each were explained to the (patient/caregiver).  Consent for procedure obtained. Time Out: Verified patient identification, verified procedure, site/side was marked, verified correct patient position, special equipment/implants available, medications/allergies/relevent history reviewed, required imaging and test results available.  Performed  Maximum sterile technique was used including antiseptics, cap, gloves, gown, hand hygiene, mask and sheet. Skin prep: Chlorhexidine; local anesthetic administered 20 gauge catheter was inserted into right femoral artery using the Seldinger technique.  Evaluation Blood flow good; BP tracing good. Complications: No apparent complications.   Richardson Landry Minor ACNP Maryanna Shape PCCM Pager [phone] till 3 pm If no answer page [phone] 2016/02/21, 8:29 AM  Rush Farmer, M.D. [facility] Pulmonary/Critical Care Medicine. Pager: [phone]. After hours pager: [phone].

## 2016-02-08 NOTE — Progress Notes (Signed)
PULMONARY / CRITICAL CARE MEDICINE   Name: Kimberly Baker MRN: [id number] DOB: 01/09/1934    ADMISSION DATE:  01/12/2016 CONSULTATION DATE:  02/01/16  REFERRING MD:  Dr. Vertell Limber   CHIEF COMPLAINT:  Altered Mental Status   Brief :   83 y/o F with PMH of anemia, arthritis, DM, GERD, headaches, HTN, hypothyroidism, neuropathy and chronic back pain s/p recent injection without relief.  She has a follow up MRI that was unable to be completed but did show advanced disc degeneration at L1-L2, L4-5 and L5-S1.  It also noted L4-5 foraminal impingement due to disc herniation and facet arthritis thought to be the cause of her right sided radiculopathy.  She was admitted on 12/22 for redo decompression with fusion, pedicle screw fixation & posterolateral arthrodesis.    Post procedure, she was alert and conversant per notes with good strength.  On 12/23, she reported headache & nausea.  She was on flat bedrest at that time.  She was treated with vistaril and zofran.  Daughter reports after vistaril, she went to sleep and has not been the same since.  Later in the am, NSGY was called back to assess the patient for mental status change > she was found to have a receptive aphasia, not following commands and a slight right hemiparesis.  STAT CT of the head was obtained, neurology & medicine consulted.  She was last known normal at 7pm on 12/22.  CT revealed a subarachnoid hemorrhage.  NSGY re-evaluated later in the afternoon and felt CT findings may be related to unintended durotomy.  Neurology felt the patients symptoms to be out of proportion to the amount of SAH.  STAT EEG was ordered which was consistent with a focal seizure in the left temporal lobe that resolved with ativan administration. She was loaded with fosphenytoin and keppra.  On 12/24, she was noted to be more somnolent and an MRI was obtained which demonstrated moderate volume subarachnoid and intraventricular hemorrhage similar to recent CT, small acute  infarcts in the left cingulate gyrus/posterior left frontal lobe, tiny right subdural hygroma without mass effect.  Blood pressures were increased overnight to 190's.    PCCM called for assistance with blood pressure management.   SUBJECTIVE:  12/28 urgently intubated. Requiring pressor support. Tx to ICU   VITAL SIGNS: BP (!) 100/53   Pulse 93   Temp (!) 102.2 F (39 C) (Axillary)   Resp (!) 28   Ht 4\' 10"  (1.473 m)   Wt 138 lb 10.7 oz (62.9 kg)   SpO2 97%   BMI 28.98 kg/m   HEMODYNAMICS:    VENTILATOR SETTINGS: Vent Mode: PRVC FiO2 (%):  [60 %-100 %] 60 % Set Rate:  [15 bmp-32 bmp] 32 bmp Vt Set:  [320 mL] 320 mL PEEP:  [5 cmH20-8 cmH20] 5 cmH20 Plateau Pressure:  [17 cmH20-20 cmH20] 19 cmH20  INTAKE / OUTPUT: I/O last 3 completed shifts: In: 1552.8 [I.V.:976.8; NG/GT:53.5; IV Piggyback:522.5] Out: 2270 [Urine:2270]  PHYSICAL EXAMINATION: General:  Elderly female, critically ill appearing, hypotensive, intubated Neuro:  No response to verbal stimuli, sedated on vent HEENT:  MM pink/dry, perl, left eye with ? Lens implant Cardiovascular:  s1s2 rrr, no m/r/g  Lungs:decreased bs in bases, air hungry despite rr 32 Abdomen:  Soft, non-distended, bsx4 active  Musculoskeletal:  No acute deformities  Skin:  Warm/dry, rt arm with swelling ecchymosis   LABS:  BMET  Recent Labs Lab 02/01/16 0536 02/02/16 0501 02/03/16 0518  NA 139 142 149*  K 3.3* 3.4* 3.2*  CL 107 111 116*  CO2 27 18* 20*  BUN 15 17 25*  CREATININE 1.09* 1.16* 1.26*  GLUCOSE 275* 244* 263*   Electrolytes  Recent Labs Lab 02/01/16 0536 02/02/16 0501 02/03/16 0518  CALCIUM 8.6* 8.8* 8.7*  MG  --  1.7 1.8  PHOS  --  <1.0* 1.9*   CBC  Recent Labs Lab 02/01/16 1756 02/02/16 0501 02/03/16 0518  WBC 13.0* 11.1* 13.0*  HGB 8.9* 8.9* 9.6*  HCT 27.5* 27.5* 30.2*  PLT 216 226 288   Coag's No results for input(s): APTT, INR in the last 168 hours.  Sepsis Markers No results for  input(s): LATICACIDVEN, PROCALCITON, O2SATVEN in the last 168 hours.  ABG  Recent Labs Lab 02/03/16 1100 03-05-16 0151 03/05/16 0639  PHART 7.452* 7.503* 7.082*  PCO2ART 27.0* 25.2* 73.8*  PO2ART 113* 73.1* 271.0*   Liver Enzymes  Recent Labs Lab 01/30/16 0446  AST 77*  ALT 37  ALKPHOS 42  BILITOT 1.0  ALBUMIN 3.1*   Cardiac Enzymes No results for input(s): TROPONINI, PROBNP in the last 168 hours.  Glucose  Recent Labs Lab 02/02/16 1927 02/03/16 0751 02/03/16 1231 02/03/16 2030 02/03/16 2251 03/05/2016 0412  GLUCAP 265* 254* 239* 349* 415* 242*   Imaging Dg Chest Port 1 View  Result Date: 2016-03-05 CLINICAL DATA:  Hypoxia EXAM: PORTABLE CHEST 1 VIEW COMPARISON:  February 02, 2016 FINDINGS: Endotracheal tube tip is 1.6 cm above the carina. Central catheter tip is in the superior vena cava. Nasogastric tube tip and side port are below the diaphragm. No pneumothorax. There is airspace consolidation in the left lower lobe with small left pleural effusion. Patchy infiltrate remains in the right base as well. Heart size is within normal limits. Pulmonary vascularity is normal. No adenopathy. There is atherosclerotic calcification in the aorta. There is degenerative change in each shoulder. There is postoperative change in the lower cervical spine. IMPRESSION: Tube and catheter positions as described without pneumothorax. Persistent left lower lobe consolidation with small left effusion. Patchy infiltrate right base also present and stable. No new opacity. Stable cardiac silhouette. There is aortic atherosclerosis. Electronically Signed   By: Lowella Grip III M.D.   On: 03-05-16 07:22   Dg Abd Portable 1v  Result Date: 03-05-16 CLINICAL DATA:  Orogastric tube placement EXAM: PORTABLE ABDOMEN - 1 VIEW COMPARISON:  February 03, 2016 FINDINGS: Feeding tube is been removed. Orogastric tube is now present with tip and side port in stomach. The overall bowel gas pattern is  unremarkable. No bowel obstruction or free air is evident. Lung bases are clear. There is postoperative change in lower lumbar spine region. IMPRESSION: Orogastric tube tip and side port in stomach. Overall bowel gas pattern unremarkable. No evident free air. Electronically Signed   By: Lowella Grip III M.D.   On: 2016/03/05 07:24   Dg Abd Portable 1v  Result Date: 02/03/2016 CLINICAL DATA:  NG tube placement EXAM: PORTABLE ABDOMEN - 1 VIEW COMPARISON:  None. FINDINGS: Nasogastric to: The stomach. There is no bowel dilatation to suggest obstruction. There is no evidence of pneumoperitoneum, portal venous gas or pneumatosis. There are no pathologic calcifications along the expected course of the ureters. Posterior lumbar fusion from L4 through S1. IMPRESSION: NG tube coiled in the stomach. Electronically Signed   By: Kathreen Devoid   On: 02/03/2016 13:07   STUDIES:  12/23 CT Head >> small to mod subarachnoid hemorrhage.   12/23 EEG >> consistent with a focal  seizure in the L temporal lobe that resolved with ativan administration.  12/24 MRI >> moderate volume subarachnoid and intraventricular hemorrhage similar to recent CT, small acute infarcts in the left cingulate gyrus/posterior left frontal lobe, tiny right subdural hygroma without mass effect. 12/26 EEG >  abnormal continuous video EEG due to loss of normal background and diffuse slow activity, no epileptiform discharges   12/26 CT Head > SAH mild improvement, IVH unchanged 12/27 rue doppler neg  CULTURES: Blood 12/25 >  Sputum 12/26 >  ANTIBIOTICS: 12/28 vanc>> 12/28 zosyn>>  SIGNIFICANT EVENTS: 12/22  Admit for lumbar decompression with fusion, pedicle screw fixation & posterolateral arthrodesis.   12/23  AMS with CT c/w SAH, EEG + for focal seizures 12/25  PCCM consulted for medical management  12/28 intubated 12/28 hypotensive  LINES/TUBES: 12/28 ET>> 12/28 rt fem aline>> Left PICC>>  DISCUSSION: 83 y/o F admitted for  lumbar decompression with fusion.  Post-operatively developed AMS and was found to have a SAH and focal seizures.  Intubated 12/28 for acute resp failure and suspected aspiration.  ASSESSMENT / PLAN:  NEUROLOGIC A:   Acute Encephalopathy - likely multifactorial in the setting of SAH, seizures, delirium, + sedating medications Small to Moderate SAH  Seizures  P:   RASS goal: 0 Minimize sedating medications as able  Defer work up to Neurology / NSGY AED's per Neurology  Seizure precautions  With acute change 12/28 in mental status may need to repeat ct head.  PULMONARY A: At Risk Aspiration / Respiratory Failure - in the setting of neurologic dysfunction  Acute Hypoxic Respiratory Failure  Intubated 12/28 with suspected aspiration ARDS protocol P:   Vent bundle RR increased to 32 for ph ,7.1 Abg pending  CARDIOVASCULAR A:  Hypertension > improved 12/26 12/28 hypoptension P:  Hold home norvasc, coreg, lasix PRN labetalol  SBP goal < 160  Levo to keep sbp>90  RENAL Lab Results  Component Value Date   CREATININE 1.26 (H) 02/03/2016   CREATININE 1.16 (H) 02/02/2016   CREATININE 1.09 (H) 02/01/2016    Recent Labs Lab 02/01/16 0536 02/02/16 0501 02/03/16 0518  K 3.3* 3.4* 3.2*    Recent Labs Lab 02/01/16 0536 02/02/16 0501 02/03/16 0518  NA 139 142 149*      A:   Hypokalemia Hypernatremia  Hx CKD  P:   Hold home lasix   Trend BMP / UOP  Replace electrolytes as indicated  D/C NS @ 75  Start free water @ 100 q6h once cortrack in place   GASTROINTESTINAL A:   Hx GERD P:   NPO , OGT ? Aspiration 12/28 Continue PPI    Nutrition following   HEMATOLOGIC  Recent Labs  02/02/16 0501 02/03/16 0518  HGB 8.9* 9.6*    A:   Anemia  P:  Trend CBC  SCD's  No heparin with SAH  INFECTIOUS A:   At Risk Aspiration P:   Monitor fever curve / WBC Follow Culture data  ENDOCRINE CBG (last 3)   Recent Labs  02/03/16 2030 02/03/16 2251  2016/03/03 0412  GLUCAP 349* 415* 242*     A:   Hypothyroidism  DM  P:   Continue synthroid SSI,  Dc base for glucose 46 Glucose Checks Q4H  FAMILY  - Updates: Daughters updated at bedside 12/26.  Granddaughter-in-law is a Immunologist at [employer] Nira Conn).    - Inter-disciplinary family meet or Palliative Care meeting due by:  1/1  CCT=55 min  Richardson Landry Minor ACNP Maryanna Shape PCCM  Pager [phone] till 3 pm If no answer page [phone] 02/09/2016, 8:34 AM  Attending Note:  83 year old female, postop for spinal fusion.  Developed acute respiratory failure with septic shock due to an aspiration pneumonia.  Decompensated overnight and transferred to the ICU then intubated.  On exam, agonal and mottled.  I reviewed CXR myself, RLL infiltrate noted.  Requiring pressor and vent support.  Both daughters are nurses.  We had an extensive discussion.  After discussion, decision was made to make patient DNR with no further escalation of care.  Limit levophed to 20 mcg but continue treatment for now.  Head CT is ordered, if a major stroke then will withdraw.  Start bicarb drip and adjust vent.  Continue abx.  The patient is critically ill with multiple organ systems failure and requires high complexity decision making for assessment and support, frequent evaluation and titration of therapies, application of advanced monitoring technologies and extensive interpretation of multiple databases.   Critical Care Time devoted to patient care services described in this note is  35  Minutes. This time reflects time of care of this signee Dr Jennet Maduro. This critical care time does not reflect procedure time, or teaching time or supervisory time of PA/NP/Med student/Med Resident etc but could involve care discussion time.  Rush Farmer, M.D. [facility] Pulmonary/Critical Care Medicine. Pager: [phone]. After hours pager: [phone].

## 2016-02-08 NOTE — Progress Notes (Signed)
Called by bedside RN, patient is deteriorating from a hemodynamic standpoint.  Patient maxed out.  Spoke with entire family, after discussion, decision was made to make patient a full DNR, start morphine and slowly d/c pressors.  Patient's husband has dementia and at the family's request and informed him of a simplified version of events.    The patient is critically ill with multiple organ systems failure and requires high complexity decision making for assessment and support, frequent evaluation and titration of therapies, application of advanced monitoring technologies and extensive interpretation of multiple databases.   Critical Care Time devoted to patient care services described in this note is  60  Minutes. This time reflects time of care of this signee Dr Jennet Maduro. This critical care time does not reflect procedure time, or teaching time or supervisory time of PA/NP/Med student/Med Resident etc but could involve care discussion time.  Rush Farmer, M.D. [facility] Pulmonary/Critical Care Medicine. Pager: [phone]. After hours pager: [phone].

## 2016-02-08 NOTE — Progress Notes (Signed)
Profound worsening of medical condition Required intubation due to respiratory failure Patient with profound septic shock Family wishes to transition to comfort care and to withdraw support No additional recommendations at this time We are available if needed

## 2016-02-08 NOTE — Progress Notes (Signed)
    emd note  Per eRN - daughter has gotten back stating that "intubate if needed" . More to borrow time for husband to come to terms  Camera RR 42-51 times/minute   Recent Labs Lab 02/03/16 1100 02-07-16 0151  PHART 7.452* 7.503*  PCO2ART 27.0* 25.2*  PO2ART 113* 73.1*  HCO3 18.5* 19.6*  O2SAT 98.4 95.7    A Acute resp disterss/failure Resp alk  P Move to ICU Monitor closely - low threshold to intubate  Dr. Brand Males, M.D., [facility].C.P Pulmonary and Critical Care Medicine Staff Physician [place] Pulmonary and Critical Care Pager: [phone], If no answer or between  15:00h - 7:00h: call [phone]  02-07-2016 2:15 AM

## 2016-02-08 NOTE — Progress Notes (Signed)
PCCM Interval Progress Note  Called to bedside to assess pt for respiratory distress.  Pt tachypneic with RR in the 50's along with paradoxical pattern.  Now also with upper airway gurgling which is new per RN.  RN informs me that pt has gradually worsened throughout the night.  Chart reviewed > family were to meet regarding goals of care but unfortunately have not been able to discuss much as one of daughters husbands has been hospitalized at [facility] and required mechanical ventilation.  Daughter Kimberly Baker not sure how to proceed but asks that we attempt to buy as much time as we can so that she can discuss further with siblings.  We discussed options including PRN morphine pushes in hopes that it would ease her respiratory pattern.  I also mentioned that while we could have some success with the morphine, we could also be in a worse off position if it were to decrease her respiratory drive too much, etc.  Kimberly Baker very understanding (she is an ICU nurse) and requests that we try the morphine first and assess Kimberly Baker response before deciding on whether or not to proceed with intubation.   1-2mg  Morphine q2hrs ordered and RN asked to monitor closely.  She will call back with any changes.  I have also relayed this with Warren Lacy MD (Dr. Chase Caller) who will monitor pt electronically through [facility].  CC time: 20 minutes.   Montey Hora, [place] Pulmonary & Critical Care Medicine Pager: [phone]  or [phone] 02-18-16, 1:33 AM

## 2016-02-08 NOTE — Progress Notes (Signed)
Inpatient Diabetes Program Recommendations  AACE/ADA: New Consensus Statement on Inpatient Glycemic Control (2015)  Target Ranges:  Prepandial:   less than 140 mg/dL      Peak postprandial:   less than 180 mg/dL (1-2 hours)      Critically ill patients:  140 - 180 mg/dL   Results for FOLASADE, MARENTETTE (MRN [id number]) as of February 27, 2016 10:18  Ref. Range 02/03/2016 07:51 02/03/2016 12:31 02/03/2016 20:30 02/03/2016 22:51 27-Feb-2016 04:12 2016-02-27 08:41 Feb 27, 2016 08:43  Glucose-Capillary Latest Ref Range: 65 - 99 mg/dL 254 (H) 239 (H) 349 (H) 415 (H)  Novolog 10 units@22 :23  Lantus 12 units @ 22:13  Novolog 10 units @ 00:23 242 (H)  Novolog 10 units @ 4:59 39 (LL) 47 (L)   Review of Glycemic Control  Outpatient Diabetes medications:  Lantus 12 units QHS, Humalog 4-10 units TID with meals Current orders for Inpatient glycemic control: None  Inpatient Diabetes Program Recommendations:  Insulin - Basal: Noted Lantus was discontinued. Fasting glucose 39 mg/dl at 8:41 this morning. Anticipate hypoglycemia due to patient receiving a total of Novolog 30 units over 7 hour period last night (Novolog 10 units on 12/27 @ 22:23, Novolog 10 units on 12/28 @ 00:23, and Novolog 10 units on 12/28 @ 4:59). Please consider reordering Lantus but increase to Lantus 15 units QHS. Correction (SSI): Noted Novolog 3-10 units PRN was discontinued this morning. Please use ICU Phase 1 Glycemic Control order set to order CBGs and Novolog Q4H. Insulin - Meal Coverage: Once tube feedings are started, will likely need to order Novolog for tube feeding coverage Q4H (in addition to Novolog correction scale).  Thanks, Kimberly Alderman, RN, MSN, CDE Diabetes Coordinator Inpatient Diabetes Program [phone] (Team Pager from 8am to 5pm)

## 2016-02-08 NOTE — Progress Notes (Signed)
   02/24/16 1445  Clinical Encounter Type  Visited With Patient and family together  Visit Type Other (Comment) ([place] consult)  Spiritual Encounters  Spiritual Needs Emotional  Stress Factors  Patient Stress Factors Not reviewed  Family Stress Factors Not reviewed  Introduction to family. Family reported coping and no needs at this time.

## 2016-02-08 NOTE — Progress Notes (Signed)
Brief History : Kimberly Baker is a 83 y.o. female who underwent a lumbar decompression on 01/15/2016. She was having nausea after surgery and after atarax, she went to sleep around 7:30pm. 01/30/2016 on awakening, she was found to be densely aphasic. She was taken for a CT with thoughts of doing a perfusion study; however, her plain CT showed a significant subarachnoid hemorrhage related to CSF leak. Patient deficits appeared to be out of proportion to her imaging. Initially she may have been having recurrent seizures, but EEGs showed no electrographic seizures. A benzodiazepine challenge did not result in  changes to the EEG pattern. Vasospasm was also felt to have contributed. The patient has remained obtunded with continued supportive care. There is concern for a possible hypoxic injury. Last night the patient was intubated and developed hypotension. Now on Levophed.  Subjective: Intubated. Obtunded. No family at the bedside. Apparently family on the way.  Objective: Current vital signs: BP (!) 100/53   Pulse 93   Temp (!) 102.2 F (39 C) (Axillary)   Resp (!) 28   Ht '4\' 10"'$  (1.473 m)   Wt 62.9 kg (138 lb 10.7 oz)   SpO2 97%   BMI 28.98 kg/m  Vital signs in last 24 hours: Temp:  [98.7 F (37.1 C)-102.2 F (39 C)] 102.2 F (39 C) (12/28 0400) Pulse Rate:  [82-106] 93 (12/28 0800) Resp:  [15-52] 28 (12/28 0800) BP: (72-203)/(34-72) 100/53 (12/28 0800) SpO2:  [92 %-100 %] 97 % (12/28 0800) FiO2 (%):  [60 %-100 %] 60 % (12/28 0753) Weight:  [62.9 kg (138 lb 10.7 oz)-67.5 kg (148 lb 13 oz)] 62.9 kg (138 lb 10.7 oz) (12/28 0403)  Intake/Output from previous day: 12/27 0701 - 12/28 0700 In: 620.3 [I.V.:151.8; NG/GT:53.5; IV Piggyback:415] Out: 1220 [Urine:1220] Intake/Output this shift: Total I/O In: 50 [IV Piggyback:50] Out: -  Nutritional status: Diet NPO time specified  General Exam: Rapid respirations up to 40 breaths/min. Most consistent with central neurogenic  hyperventilation.  RUE edema.   Neurologic Exam: Ment: Eyes closed, do not open to voice or with tactile stimulation. No spontaneous movement. Does not attempt to communicate.   CN: PERRL. Does not fixate on visual stimuli. No blink to threat. Reacts slightly to noxious stimuli.  Motor/Sensory: Semipurposeful upper extremity movements to noxious stimuli. Weakly responds to noxious stimuli applied to lower extremities.   Lab Results: Results for orders placed or performed during the hospital encounter of 01/20/2016 (from the past 48 hour(s))  Culture, respiratory (NON-Expectorated)     Status: None (Preliminary result)   Collection Time: 02/02/16  1:20 PM  Result Value Ref Range   Specimen Description TRACHEAL ASPIRATE    Special Requests NONE    Gram Stain      ABUNDANT WBC PRESENT, PREDOMINANTLY PMN ABUNDANT GRAM POSITIVE COCCI IN PAIRS IN CLUSTERS FEW GRAM NEGATIVE RODS RARE SQUAMOUS EPITHELIAL CELLS PRESENT    Culture      ABUNDANT STAPHYLOCOCCUS AUREUS CULTURE REINCUBATED FOR BETTER GROWTH    Report Status PENDING   Glucose, capillary     Status: Abnormal   Collection Time: 02/02/16  3:47 PM  Result Value Ref Range   Glucose-Capillary 205 (H) 65 - 99 mg/dL   Comment 1 Notify RN   Glucose, capillary     Status: Abnormal   Collection Time: 02/02/16  7:27 PM  Result Value Ref Range   Glucose-Capillary 265 (H) 65 - 99 mg/dL  Basic metabolic panel     Status: Abnormal  Collection Time: 02/03/16  5:18 AM  Result Value Ref Range   Sodium 149 (H) 135 - 145 mmol/L   Potassium 3.2 (L) 3.5 - 5.1 mmol/L   Chloride 116 (H) 101 - 111 mmol/L   CO2 20 (L) 22 - 32 mmol/L   Glucose, Bld 263 (H) 65 - 99 mg/dL   BUN 25 (H) 6 - 20 mg/dL   Creatinine, Ser 1.26 (H) 0.44 - 1.00 mg/dL   Calcium 8.7 (L) 8.9 - 10.3 mg/dL   GFR calc non Af Amer 39 (L) >60 mL/min   GFR calc Af Amer 45 (L) >60 mL/min    Comment: (NOTE) The eGFR has been calculated using the CKD EPI equation. This calculation  has not been validated in all clinical situations. eGFR's persistently <60 mL/min signify possible Chronic Kidney Disease.    Anion gap 13 5 - 15  CBC     Status: Abnormal   Collection Time: 02/03/16  5:18 AM  Result Value Ref Range   WBC 13.0 (H) 4.0 - 10.5 K/uL   RBC 3.21 (L) 3.87 - 5.11 MIL/uL   Hemoglobin 9.6 (L) 12.0 - 15.0 g/dL   HCT 30.2 (L) 36.0 - 46.0 %   MCV 94.1 78.0 - 100.0 fL   MCH 29.9 26.0 - 34.0 pg   MCHC 31.8 30.0 - 36.0 g/dL   RDW 13.9 11.5 - 15.5 %   Platelets 288 150 - 400 K/uL  Phosphorus     Status: Abnormal   Collection Time: 02/03/16  5:18 AM  Result Value Ref Range   Phosphorus 1.9 (L) 2.5 - 4.6 mg/dL  Magnesium     Status: None   Collection Time: 02/03/16  5:18 AM  Result Value Ref Range   Magnesium 1.8 1.7 - 2.4 mg/dL  Glucose, capillary     Status: Abnormal   Collection Time: 02/03/16  7:51 AM  Result Value Ref Range   Glucose-Capillary 254 (H) 65 - 99 mg/dL   Comment 1 Notify RN    Comment 2 Document in Chart   Blood gas, arterial     Status: Abnormal   Collection Time: 02/03/16 11:00 AM  Result Value Ref Range   O2 Content 2.0 L/min   Delivery systems NASAL CANNULA    pH, Arterial 7.452 (H) 7.350 - 7.450   pCO2 arterial 27.0 (L) 32.0 - 48.0 mmHg   pO2, Arterial 113 (H) 83.0 - 108.0 mmHg   Bicarbonate 18.5 (L) 20.0 - 28.0 mmol/L   Acid-base deficit 4.7 (H) 0.0 - 2.0 mmol/L   O2 Saturation 98.4 %   Patient temperature 99.8    Collection site LEFT RADIAL    Drawn by [id number]    Sample type ARTERIAL DRAW    Allens test (pass/fail) PASS PASS  Glucose, capillary     Status: Abnormal   Collection Time: 02/03/16 12:31 PM  Result Value Ref Range   Glucose-Capillary 239 (H) 65 - 99 mg/dL   Comment 1 Notify RN    Comment 2 Document in Chart   Glucose, capillary     Status: Abnormal   Collection Time: 02/03/16  8:30 PM  Result Value Ref Range   Glucose-Capillary 349 (H) 65 - 99 mg/dL  Glucose, capillary     Status: Abnormal   Collection Time:  02/03/16 10:51 PM  Result Value Ref Range   Glucose-Capillary 415 (H) 65 - 99 mg/dL  Blood gas, arterial     Status: Abnormal   Collection Time: February 05, 2016  1:51 AM  Result Value Ref Range   FIO2 30.00    O2 Content 2.0 L/min   Delivery systems NASAL CANNULA    pH, Arterial 7.503 (H) 7.350 - 7.450   pCO2 arterial 25.2 (L) 32.0 - 48.0 mmHg   pO2, Arterial 73.1 (L) 83.0 - 108.0 mmHg   Bicarbonate 19.6 (L) 20.0 - 28.0 mmol/L   Acid-base deficit 3.1 (H) 0.0 - 2.0 mmol/L   O2 Saturation 95.7 %   Patient temperature 98.6    Collection site RIGHT RADIAL    Drawn by [id number]    Sample type ARTERIAL DRAW    Allens test (pass/fail) PASS PASS  Glucose, capillary     Status: Abnormal   Collection Time: 2016/02/14  4:12 AM  Result Value Ref Range   Glucose-Capillary 242 (H) 65 - 99 mg/dL  I-STAT 3, arterial blood gas (G3+)     Status: Abnormal   Collection Time: 2016/02/14  6:39 AM  Result Value Ref Range   pH, Arterial 7.082 (LL) 7.350 - 7.450   pCO2 arterial 73.8 (HH) 32.0 - 48.0 mmHg   pO2, Arterial 271.0 (H) 83.0 - 108.0 mmHg   Bicarbonate 21.4 20.0 - 28.0 mmol/L   TCO2 23 0 - 100 mmol/L   O2 Saturation 100.0 %   Acid-base deficit 9.0 (H) 0.0 - 2.0 mmol/L   Patient temperature 102.2 F    Collection site RADIAL, ALLEN'S TEST ACCEPTABLE    Drawn by Operator    Sample type ARTERIAL    Comment NOTIFIED PHYSICIAN   Triglycerides     Status: None   Collection Time: 2016-02-14  7:00 AM  Result Value Ref Range   Triglycerides 89 <150 mg/dL  Glucose, capillary     Status: Abnormal   Collection Time: Feb 14, 2016  8:41 AM  Result Value Ref Range   Glucose-Capillary 39 (LL) 65 - 99 mg/dL   Comment 1 Repeat Test   Glucose, capillary     Status: Abnormal   Collection Time: 02/14/2016  8:43 AM  Result Value Ref Range   Glucose-Capillary 47 (L) 65 - 99 mg/dL   Comment 1 Notify RN    Comment 2 Document in Chart     Recent Results (from the past 240 hour(s))  Culture, blood (routine x 2)     Status:  None (Preliminary result)   Collection Time: 02/01/16  5:59 PM  Result Value Ref Range Status   Specimen Description BLOOD LEFT ARM  Final   Special Requests BOTTLES DRAWN AEROBIC AND ANAEROBIC 4CC  Final   Culture NO GROWTH 2 DAYS  Final   Report Status PENDING  Incomplete  Culture, blood (routine x 2)     Status: None (Preliminary result)   Collection Time: 02/01/16  6:15 PM  Result Value Ref Range Status   Specimen Description BLOOD LEFT HAND  Final   Special Requests BOTTLES DRAWN AEROBIC AND ANAEROBIC 5CC  Final   Culture NO GROWTH 2 DAYS  Final   Report Status PENDING  Incomplete  Culture, respiratory (NON-Expectorated)     Status: None (Preliminary result)   Collection Time: 02/02/16  1:20 PM  Result Value Ref Range Status   Specimen Description TRACHEAL ASPIRATE  Final   Special Requests NONE  Final   Gram Stain   Final    ABUNDANT WBC PRESENT, PREDOMINANTLY PMN ABUNDANT GRAM POSITIVE COCCI IN PAIRS IN CLUSTERS FEW GRAM NEGATIVE RODS RARE SQUAMOUS EPITHELIAL CELLS PRESENT    Culture   Final    ABUNDANT STAPHYLOCOCCUS AUREUS CULTURE  REINCUBATED FOR BETTER GROWTH    Report Status PENDING  Incomplete    Lipid Panel  Recent Labs  02/13/16 0700  TRIG 89    Studies/Results:  Ct Head Wo Contrast 02/02/2016  Subarachnoid hemorrhage shows mild interval improvement.   Intraventricular hemorrhage unchanged.   Negative for hydrocephalus.   No acute ischemic infarction.    Dg Chest Port 1 View 02/13/16 Tube and catheter positions as described without pneumothorax. Persistent left lower lobe consolidation with small left effusion. Patchy infiltrate right base also present and stable. No new opacity. Stable cardiac silhouette. There is aortic atherosclerosis.   Dg Abd Portable 1v 02/13/16 Orogastric tube tip and side port in stomach. Overall bowel gas pattern unremarkable. No evident free air.   Dg Abd Portable 1v 02/03/2016 NG tube coiled in the stomach.     Medications:  Scheduled: . chlorhexidine gluconate (MEDLINE KIT)  15 mL Mouth Rinse BID  . dextrose  1 ampule Intravenous Once  . free water  100 mL Per Tube Q6H  . insulin glargine  12 Units Subcutaneous QHS  . levETIRAcetam  750 mg Intravenous Q12H  . levothyroxine  25 mcg Intravenous QAC breakfast  . mouth rinse  15 mL Mouth Rinse 10 times per day  . metoprolol  5 mg Intravenous Q6H  . morphine      . NiMODipine  60 mg Per Tube Q4H  . pantoprazole (PROTONIX) IV  40 mg Intravenous Q12H  . phenytoin (DILANTIN) IV  75 mg Intravenous Q8H  . piperacillin-tazobactam (ZOSYN)  IV  3.375 g Intravenous Q8H  . sodium chloride flush  10-40 mL Intracatheter Q12H  . sodium chloride flush  3 mL Intravenous Q12H  . vancomycin  750 mg Intravenous Q24H    Assessment: 83 yo F with altered mental status in the setting of subarachnoid hemorrhage following CSF leak.  1. Initially may have been having recurrent seizures, but EEG showed no electrographic seizures. A benzodiazepine challenge did not result in changes to the EEG pattern. Of note, EEG background slowing c/w moderate encephalopathy of non specific etiologies; relative left frontotemporal slowing might be suggestive of neuronal dysfunction in that region.  2. MRI brain revealed no change to previously seen blood product distribution previously seen on CT. Small cortically based foci of restricted diffusion appeared most consistent with small cortical strokes, likely secondary to vasospasm, but could also represent cytotoxic edema secondary to persistent seizure activity initially.  3. CT head on 12/26 showed mild interval improvement of subarachnoid hemorrhage relative to prior. Intraventricular hemorrhage was unchanged. The scan was negative for hydrocephalus or acute ischemic infarction.  Recommendations: 1. Continue Keppra and Dilantin.  2. Given her decreasing responsiveness to stimuli and Cheyne-Stokes breathing evolving to a central  neurogenic hyperventilation pattern, her prognosis has worsened. Initially it was thought that with time, clearance of blood products might result in improvement, but that there was a relatively high probability that long term neurological outcome would be poor. Family now considering DNR/DNI status as well as how aggressive therapy should be given the patient's expressed desire in the past (per daughter) that she would not want to be dependent upon advanced life sustaining measures such as mechanical ventilation.   3. Continue nimodipine as prophylaxis against vasospasm (60 mg per NGT q4h x 21 days).  4. Levophed for BP support. 5. Discused with CCM. They plan to order a Head CT for further evaluation. 6. Family on their way to see patient. 7. Discussed with Dr. Cheral Marker  LOS: 6 days    Mikey Bussing PA-C [facility] Pager [phone] 20-Feb-2016, 9:25 AM  Electronically signed: Dr. Kerney Elbe

## 2016-02-08 NOTE — Progress Notes (Signed)
eLink Physician-Brief Progress Note Patient Name: Kimberly Baker DOB: July 30, 1933 MRN: [id number]  Recent Labs Lab 02/03/16 1100 03-02-16 0151 2016-03-02 0639  PHART 7.452* 7.503* 7.082*  PCO2ART 27.0* 25.2* 73.8*  PO2ART 113* 73.1* 271.0*  HCO3 18.5* 19.6* 21.4  TCO2  --   --  23  O2SAT 98.4 95.7 100.0     Resp acicdis - RR on vent is 16  PLAN increae RR to 30  ABG recheck by bedside MD   Intervention Category Evaluation Type: Other  Mathews Stuhr Mar 02, 2016, 6:43 AM

## 2016-02-08 NOTE — Progress Notes (Signed)
Called to assess the patient for worsening mental status with resp failure  Pt is breathing in 50s with paradoxical abdominal movements. Increasing gurgling sounds from upper airway, Blood pressure (!) 136/52, pulse (!) 103, temperature (!) 102.2 F (39 C), temperature source Axillary, resp. rate (!) 40, height 4\' 10"  (1.473 m), weight 138 lb 10.7 oz (62.9 kg), SpO2 95 %. Somnolent, marked resp distress CVS- RRR, tachycardia RS- B/L Coarse rhonchi Abd- distended, + BS Ext- no edema  Labs and imaging reviewed.  Assessment/Plan: S/p lumbar decompression SAH, altered mental status Inability to protect airway Aspiration- Copious tube feeds noted in the pharynx and glottis during intubation  - Intubated, vent orders placed. - ABG in 1 hr, Follow CXR - Place NG tube to suction. Abd x ray - Continue vanco, Start zosyn for aspiration.  Critical care time- 35 mins.  Marshell Garfinkel MD [facility] Pager [phone] If no answer or after 3pm call: [phone] 03-02-2016, 5:35 AM

## 2016-02-08 DEATH — deceased

## 2016-02-09 LAB — GLUCOSE, CAPILLARY: GLUCOSE-CAPILLARY: 39 mg/dL — AB (ref 65–99)

## 2016-03-10 NOTE — Discharge Summary (Signed)
Physician Discharge Summary  Patient ID: Kimberly Baker MRN: [id number] DOB/AGE: August 19, 1933 83 y.o.  Admit date: 01/28/2016 Discharge date: 02/24/2016  Admission Diagnoses:Herniated lumbar disc with recurrent stenosis and severe radiculopathy  Discharge Diagnoses: Same Active Problems:   Spinal stenosis, lumbar region, with neurogenic claudication   Convulsions ([place])   Acute respiratory failure with hypoxia ([facility])   Acute encephalopathy   Goals of care, counseling/discussion   Discharged Condition: deceased  Hospital Course: Patient underwent redo decompression with discectomy and fusion L 45 and L 5 S 1 levels.  Surgery was uncomplicated with the exception of CSF leak, which was repaired intraoperatively.  The patient initially did well after surgery, but subsequently developed aphasia, seizures and depressed level of consciousness.  Neurology consult was obtained, patient was loaded with anticonvulsants, moved to stepdown, had head CT and MRI.  She underwent continuous EEG monitoring, which did not demonstrate status epilepticus.  Despite additional time and supportive care, level of consciousness did not improve.  Patient developed aspiration pneumonia, required intubation, later went on to worsening medical condition with septic shock and multi-organ failure.  After discussion with family members, many of whom are in the medical field, it was elected to change to supportive care, and ultimately to withdraw care in ICU.  The patient subsequently expired.  Consults: pulmonary/intensive care and neurology  Significant Diagnostic Studies: radiology: MRI: subarachnoid hemorrhage, questionable infarction  Treatments: As above  Discharge Exam: Blood pressure (!) 91/56, pulse (!) 101, temperature (!) 102.6 F (39.2 C), temperature source Axillary, resp. rate (!) 31, height 4\' 10"  (1.473 m), weight 62.9 kg (138 lb 10.7 oz), SpO2 94 %. Deceased  Disposition: Deceased   Allergies as of  02-22-16      Reactions   Compazine [prochlorperazine Edisylate] Other (See Comments)   Seizure   Levaquin [levofloxacin In D5w] Rash   Tape Other (See Comments)   PAPER TAPE ONLY brusies skin      Medication List    ASK your doctor about these medications   amLODipine 5 MG tablet Commonly known as:  NORVASC Take 5 mg by mouth daily.   benzonatate 200 MG capsule Commonly known as:  TESSALON Take 200 mg by mouth every 8 (eight) hours as needed for cough.   calcium carbonate 600 MG Tabs tablet Commonly known as:  OS-CAL Take 1,200 mg by mouth 2 (two) times daily with a meal.   carvedilol 12.5 MG tablet Commonly known as:  COREG Take 12.5 mg by mouth 2 (two) times daily with a meal.   cetirizine 10 MG tablet Commonly known as:  ZYRTEC Take 10 mg by mouth daily.   DULoxetine 30 MG capsule Commonly known as:  CYMBALTA Take 30 mg by mouth every evening.   fenofibrate 160 MG tablet Take 160 mg by mouth daily.   fish oil-omega-3 fatty acids 1000 MG capsule Take 2 g by mouth 2 (two) times daily.   furosemide 40 MG tablet Commonly known as:  LASIX Take 40 mg by mouth daily.   HUMALOG KWIKPEN 100 UNIT/ML KiwkPen Generic drug:  insulin lispro Inject 4-10 Units into the skin 3 (three) times daily before meals.   LANTUS SOLOSTAR 100 UNIT/ML Solostar Pen Generic drug:  Insulin Glargine Inject 12 Units into the skin at bedtime.   levothyroxine 50 MCG tablet Commonly known as:  SYNTHROID, LEVOTHROID Take 50 mcg by mouth daily.   montelukast 10 MG tablet Commonly known as:  SINGULAIR Take 10 mg by mouth daily.   omeprazole 20  MG capsule Commonly known as:  PRILOSEC Take 20 mg by mouth daily.   oxyCODONE 5 MG immediate release tablet Commonly known as:  Oxy IR/ROXICODONE Take 5 mg by mouth 3 (three) times daily as needed. For severe pain.   pravastatin 40 MG tablet Commonly known as:  PRAVACHOL Take 40 mg by mouth every evening.   topiramate 100 MG  tablet Commonly known as:  TOPAMAX Take 100 mg by mouth 2 (two) times daily.   TYLENOL 8 HOUR 650 MG CR tablet Generic drug:  acetaminophen Take 650-1,300 mg by mouth every 8 (eight) hours as needed for pain.   VENTOLIN HFA 108 (90 Base) MCG/ACT inhaler Generic drug:  albuterol Inhale 2 puffs into the lungs every 4 (four) hours as needed for wheezing or shortness of breath.   zolpidem 10 MG tablet Commonly known as:  AMBIEN Take 5-10 mg by mouth at bedtime.        Signed: Peggyann Shoals, MD 02/24/2016, 1:58 PM

## 2016-06-14 NOTE — Addendum Note (Signed)
Addendum  created 06/14/16 4276 by Roberts Gaudy, MD   Delete clinical note
